# Patient Record
Sex: Male | Born: 1950 | Race: White | Hispanic: No | Marital: Married | Smoking: Former smoker
Health system: Southern US, Community
[De-identification: ages and names within clinical notes are randomized; demographics above are authoritative.]

## PROBLEM LIST (undated history)

## (undated) DIAGNOSIS — K611 Rectal abscess: Secondary | ICD-10-CM

## (undated) DIAGNOSIS — G4730 Sleep apnea, unspecified: Secondary | ICD-10-CM

## (undated) DIAGNOSIS — G1221 Amyotrophic lateral sclerosis: Secondary | ICD-10-CM

## (undated) DIAGNOSIS — K219 Gastro-esophageal reflux disease without esophagitis: Secondary | ICD-10-CM

## (undated) DIAGNOSIS — J80 Acute respiratory distress syndrome: Secondary | ICD-10-CM

## (undated) DIAGNOSIS — Z77098 Contact with and (suspected) exposure to other hazardous, chiefly nonmedicinal, chemicals: Secondary | ICD-10-CM

## (undated) DIAGNOSIS — H47019 Ischemic optic neuropathy, unspecified eye: Secondary | ICD-10-CM

## (undated) DIAGNOSIS — M1990 Unspecified osteoarthritis, unspecified site: Secondary | ICD-10-CM

## (undated) DIAGNOSIS — H409 Unspecified glaucoma: Secondary | ICD-10-CM

## (undated) DIAGNOSIS — R269 Unspecified abnormalities of gait and mobility: Secondary | ICD-10-CM

## (undated) DIAGNOSIS — Z8709 Personal history of other diseases of the respiratory system: Secondary | ICD-10-CM

## (undated) DIAGNOSIS — F419 Anxiety disorder, unspecified: Secondary | ICD-10-CM

## (undated) DIAGNOSIS — E119 Type 2 diabetes mellitus without complications: Secondary | ICD-10-CM

## (undated) DIAGNOSIS — E7800 Pure hypercholesterolemia, unspecified: Secondary | ICD-10-CM

## (undated) DIAGNOSIS — F329 Major depressive disorder, single episode, unspecified: Secondary | ICD-10-CM

## (undated) DIAGNOSIS — A419 Sepsis, unspecified organism: Secondary | ICD-10-CM

## (undated) DIAGNOSIS — F32A Depression, unspecified: Secondary | ICD-10-CM

## (undated) DIAGNOSIS — A498 Other bacterial infections of unspecified site: Secondary | ICD-10-CM

## (undated) DIAGNOSIS — C801 Malignant (primary) neoplasm, unspecified: Secondary | ICD-10-CM

## (undated) DIAGNOSIS — F4310 Post-traumatic stress disorder, unspecified: Secondary | ICD-10-CM

## (undated) HISTORY — PX: SKIN CANCER EXCISION: SHX779

## (undated) HISTORY — DX: Rectal abscess: K61.1

## (undated) HISTORY — PX: CATARACT EXTRACTION: SUR2

## (undated) HISTORY — DX: Type 2 diabetes mellitus without complications: E11.9

## (undated) HISTORY — DX: Anxiety disorder, unspecified: F41.9

## (undated) HISTORY — DX: Post-traumatic stress disorder, unspecified: F43.10

## (undated) HISTORY — PX: INCISION AND DRAINAGE PERIRECTAL ABSCESS: SHX1804

## (undated) HISTORY — DX: Contact with and (suspected) exposure to other hazardous, chiefly nonmedicinal, chemicals: Z77.098

## (undated) HISTORY — DX: Depression, unspecified: F32.A

## (undated) HISTORY — DX: Major depressive disorder, single episode, unspecified: F32.9

---
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
## 1999-03-11 ENCOUNTER — Encounter: Payer: Self-pay | Admitting: Emergency Medicine

## 1999-03-11 ENCOUNTER — Emergency Department (HOSPITAL_COMMUNITY): Admission: EM | Admit: 1999-03-11 | Discharge: 1999-03-11 | Payer: Self-pay | Admitting: Emergency Medicine

## 1999-03-20 ENCOUNTER — Encounter: Payer: Self-pay | Admitting: Orthopedic Surgery

## 1999-03-20 ENCOUNTER — Ambulatory Visit (HOSPITAL_COMMUNITY): Admission: RE | Admit: 1999-03-20 | Discharge: 1999-03-20 | Payer: Self-pay | Admitting: Orthopedic Surgery

## 1999-07-12 ENCOUNTER — Emergency Department (HOSPITAL_COMMUNITY): Admission: EM | Admit: 1999-07-12 | Discharge: 1999-07-12 | Payer: Self-pay | Admitting: Emergency Medicine

## 2000-08-18 ENCOUNTER — Inpatient Hospital Stay (HOSPITAL_COMMUNITY): Admission: EM | Admit: 2000-08-18 | Discharge: 2000-08-21 | Payer: Self-pay | Admitting: *Deleted

## 2001-02-11 DIAGNOSIS — A419 Sepsis, unspecified organism: Secondary | ICD-10-CM

## 2001-02-11 DIAGNOSIS — A498 Other bacterial infections of unspecified site: Secondary | ICD-10-CM

## 2001-02-11 DIAGNOSIS — J80 Acute respiratory distress syndrome: Secondary | ICD-10-CM

## 2001-02-11 HISTORY — PX: APPENDECTOMY: SHX54

## 2001-02-11 HISTORY — DX: Acute respiratory distress syndrome: J80

## 2001-02-11 HISTORY — DX: Other bacterial infections of unspecified site: A49.8

## 2001-02-11 HISTORY — DX: Sepsis, unspecified organism: A41.9

## 2001-07-12 ENCOUNTER — Encounter: Payer: Self-pay | Admitting: Surgery

## 2001-07-12 ENCOUNTER — Encounter (INDEPENDENT_AMBULATORY_CARE_PROVIDER_SITE_OTHER): Payer: Self-pay | Admitting: Specialist

## 2001-07-13 ENCOUNTER — Inpatient Hospital Stay (HOSPITAL_COMMUNITY): Admission: EM | Admit: 2001-07-13 | Discharge: 2001-08-05 | Payer: Self-pay | Admitting: Emergency Medicine

## 2001-07-14 ENCOUNTER — Encounter: Payer: Self-pay | Admitting: Surgery

## 2001-07-14 ENCOUNTER — Encounter: Payer: Self-pay | Admitting: Critical Care Medicine

## 2001-07-15 ENCOUNTER — Encounter: Payer: Self-pay | Admitting: Critical Care Medicine

## 2001-07-20 ENCOUNTER — Encounter: Payer: Self-pay | Admitting: Pulmonary Disease

## 2001-07-21 ENCOUNTER — Encounter: Payer: Self-pay | Admitting: Pulmonary Disease

## 2001-07-25 ENCOUNTER — Encounter: Payer: Self-pay | Admitting: Pulmonary Disease

## 2001-07-26 ENCOUNTER — Encounter: Payer: Self-pay | Admitting: Pulmonary Disease

## 2001-07-27 ENCOUNTER — Encounter: Payer: Self-pay | Admitting: Pulmonary Disease

## 2001-07-28 ENCOUNTER — Encounter: Payer: Self-pay | Admitting: Pulmonary Disease

## 2001-08-04 ENCOUNTER — Encounter: Payer: Self-pay | Admitting: Surgery

## 2001-08-05 ENCOUNTER — Encounter: Payer: Self-pay | Admitting: Surgery

## 2001-09-15 ENCOUNTER — Encounter: Payer: Self-pay | Admitting: Emergency Medicine

## 2001-09-15 ENCOUNTER — Emergency Department (HOSPITAL_COMMUNITY): Admission: EM | Admit: 2001-09-15 | Discharge: 2001-09-15 | Payer: Self-pay | Admitting: Emergency Medicine

## 2001-09-21 ENCOUNTER — Ambulatory Visit (HOSPITAL_COMMUNITY): Admission: RE | Admit: 2001-09-21 | Discharge: 2001-09-21 | Payer: Self-pay | Admitting: Gastroenterology

## 2001-09-21 ENCOUNTER — Encounter (INDEPENDENT_AMBULATORY_CARE_PROVIDER_SITE_OTHER): Payer: Self-pay | Admitting: Specialist

## 2002-10-14 ENCOUNTER — Encounter: Payer: Self-pay | Admitting: Surgery

## 2002-10-14 ENCOUNTER — Ambulatory Visit (HOSPITAL_COMMUNITY): Admission: RE | Admit: 2002-10-14 | Discharge: 2002-10-14 | Payer: Self-pay | Admitting: Surgery

## 2004-11-24 ENCOUNTER — Emergency Department (HOSPITAL_COMMUNITY): Admission: EM | Admit: 2004-11-24 | Discharge: 2004-11-24 | Payer: Self-pay | Admitting: Emergency Medicine

## 2004-11-28 ENCOUNTER — Ambulatory Visit (HOSPITAL_COMMUNITY): Admission: AD | Admit: 2004-11-28 | Discharge: 2004-11-28 | Payer: Self-pay

## 2004-12-06 ENCOUNTER — Ambulatory Visit (HOSPITAL_COMMUNITY): Admission: AD | Admit: 2004-12-06 | Discharge: 2004-12-06 | Payer: Self-pay | Admitting: General Surgery

## 2004-12-08 ENCOUNTER — Emergency Department (HOSPITAL_COMMUNITY): Admission: EM | Admit: 2004-12-08 | Discharge: 2004-12-08 | Payer: Self-pay | Admitting: Emergency Medicine

## 2005-02-11 DIAGNOSIS — Z8709 Personal history of other diseases of the respiratory system: Secondary | ICD-10-CM

## 2005-02-11 HISTORY — PX: REPLACEMENT TOTAL KNEE: SUR1224

## 2005-02-11 HISTORY — PX: KNEE ARTHROSCOPY: SUR90

## 2005-02-11 HISTORY — DX: Personal history of other diseases of the respiratory system: Z87.09

## 2008-12-19 ENCOUNTER — Encounter: Admission: RE | Admit: 2008-12-19 | Discharge: 2008-12-19 | Payer: Self-pay | Admitting: Internal Medicine

## 2008-12-23 ENCOUNTER — Ambulatory Visit (HOSPITAL_BASED_OUTPATIENT_CLINIC_OR_DEPARTMENT_OTHER): Admission: RE | Admit: 2008-12-23 | Discharge: 2008-12-23 | Payer: Self-pay | Admitting: Orthopedic Surgery

## 2009-03-22 ENCOUNTER — Inpatient Hospital Stay (HOSPITAL_COMMUNITY): Admission: RE | Admit: 2009-03-22 | Discharge: 2009-03-24 | Payer: Self-pay | Admitting: Orthopedic Surgery

## 2009-04-17 ENCOUNTER — Encounter (HOSPITAL_COMMUNITY): Admission: RE | Admit: 2009-04-17 | Discharge: 2009-05-17 | Payer: Self-pay | Admitting: Orthopedic Surgery

## 2009-05-19 ENCOUNTER — Encounter (HOSPITAL_COMMUNITY): Admission: RE | Admit: 2009-05-19 | Discharge: 2009-06-18 | Payer: Self-pay | Admitting: Orthopedic Surgery

## 2010-01-13 ENCOUNTER — Emergency Department (HOSPITAL_COMMUNITY)
Admission: EM | Admit: 2010-01-13 | Discharge: 2010-01-13 | Payer: Self-pay | Source: Home / Self Care | Admitting: Emergency Medicine

## 2010-05-03 LAB — BASIC METABOLIC PANEL
BUN: 7 mg/dL (ref 6–23)
Chloride: 100 mEq/L (ref 96–112)
Chloride: 99 mEq/L (ref 96–112)
GFR calc Af Amer: >60 mL/min (ref >60)
Glucose, Bld: 271 mg/dL — ABNORMAL HIGH (ref 70–99)
Potassium: 3.9 mEq/L (ref 3.5–5.1)
Potassium: 4 mEq/L (ref 3.5–5.1)

## 2010-05-03 LAB — URINALYSIS, ROUTINE W REFLEX MICROSCOPIC
Bilirubin Urine: NEGATIVE
Glucose, UA: >1000 mg/dL — AB
Hgb urine dipstick: NEGATIVE
Ketones, ur: NEGATIVE mg/dL
Leukocytes, UA: NEGATIVE
Nitrite: NEGATIVE
Protein, ur: NEGATIVE mg/dL
Specific Gravity, Urine: 1.01 (ref 1.005–1.030)
Urobilinogen, UA: 1 mg/dL (ref 0.0–1.0)
pH: 5.5 (ref 5.0–8.0)

## 2010-05-03 LAB — URINE MICROSCOPIC-ADD ON

## 2010-05-03 LAB — CBC
HCT: 34.3 % — ABNORMAL LOW (ref 39.0–52.0)
HCT: 36 % — ABNORMAL LOW (ref 39.0–52.0)
HCT: 43.8 % (ref 39.0–52.0)
Hemoglobin: 12.6 g/dL — ABNORMAL LOW (ref 13.0–17.0)
Hemoglobin: 15.4 g/dL (ref 13.0–17.0)
MCHC: 35.3 g/dL (ref 30.0–36.0)
MCV: 85.8 fL (ref 78.0–100.0)
MCV: 86.7 fL (ref 78.0–100.0)
MCV: 89.9 fL (ref 78.0–100.0)
Platelets: 161 10*3/uL (ref 150–400)
Platelets: 193 10*3/uL (ref 150–400)
RBC: 4.15 MIL/uL — ABNORMAL LOW (ref 4.22–5.81)
RBC: 5.1 MIL/uL (ref 4.22–5.81)
RDW: 13.1 % (ref 11.5–15.5)
WBC: 5.5 10*3/uL (ref 4.0–10.5)
WBC: 9.4 10*3/uL (ref 4.0–10.5)
WBC: 9.6 10*3/uL (ref 4.0–10.5)

## 2010-05-03 LAB — COMPREHENSIVE METABOLIC PANEL
ALT: 33 U/L (ref 0–53)
AST: 19 U/L (ref 0–37)
Albumin: 4.2 g/dL (ref 3.5–5.2)
Alkaline Phosphatase: 64 U/L (ref 39–117)
BUN: 10 mg/dL (ref 6–23)
CO2: 27 mEq/L (ref 19–32)
Calcium: 9.1 mg/dL (ref 8.4–10.5)
Chloride: 99 mEq/L (ref 96–112)
Creatinine, Ser: 0.79 mg/dL (ref 0.4–1.5)
GFR calc Af Amer: >60 mL/min (ref >60)
GFR calc non Af Amer: >60 mL/min (ref >60)
Glucose, Bld: 416 mg/dL — ABNORMAL HIGH (ref 70–99)
Potassium: 4.5 mEq/L (ref 3.5–5.1)
Sodium: 133 mEq/L — ABNORMAL LOW (ref 135–145)
Total Bilirubin: 0.6 mg/dL (ref 0.3–1.2)
Total Protein: 6.7 g/dL (ref 6.0–8.3)

## 2010-05-03 LAB — ABO/RH: ABO/RH(D): A POS

## 2010-05-03 LAB — GLUCOSE, CAPILLARY
Glucose-Capillary: 239 mg/dL — ABNORMAL HIGH (ref 70–99)
Glucose-Capillary: 245 mg/dL — ABNORMAL HIGH (ref 70–99)
Glucose-Capillary: 284 mg/dL — ABNORMAL HIGH (ref 70–99)
Glucose-Capillary: 311 mg/dL — ABNORMAL HIGH (ref 70–99)

## 2010-05-03 LAB — DIFFERENTIAL
Basophils Absolute: 0 10*3/uL (ref 0.0–0.1)
Basophils Relative: 0 % (ref 0–1)
Eosinophils Absolute: 0.1 10*3/uL (ref 0.0–0.7)
Eosinophils Relative: 1 % (ref 0–5)
Lymphocytes Relative: 28 % (ref 12–46)
Lymphs Abs: 1.5 10*3/uL (ref 0.7–4.0)
Monocytes Absolute: 0.5 10*3/uL (ref 0.1–1.0)
Monocytes Relative: 9 % (ref 3–12)
Neutro Abs: 3.4 10*3/uL (ref 1.7–7.7)
Neutrophils Relative %: 62 % (ref 43–77)

## 2010-05-03 LAB — PROTIME-INR
INR: 0.86 (ref 0.00–1.49)
Prothrombin Time: 11.6 seconds (ref 11.6–15.2)

## 2010-05-03 LAB — APTT: aPTT: 29 seconds (ref 24–37)

## 2010-05-03 LAB — POCT I-STAT GLUCOSE
Glucose, Bld: 227 mg/dL — ABNORMAL HIGH (ref 70–99)
Operator id: 238831

## 2010-05-13 DIAGNOSIS — H47019 Ischemic optic neuropathy, unspecified eye: Secondary | ICD-10-CM

## 2010-05-13 HISTORY — DX: Ischemic optic neuropathy, unspecified eye: H47.019

## 2010-05-16 LAB — BASIC METABOLIC PANEL
Calcium: 9.2 mg/dL (ref 8.4–10.5)
Chloride: 101 mEq/L (ref 96–112)
Creatinine, Ser: 0.71 mg/dL (ref 0.4–1.5)
GFR calc Af Amer: >60 mL/min (ref >60)
GFR calc non Af Amer: >60 mL/min (ref >60)

## 2010-05-16 LAB — GLUCOSE, CAPILLARY: Glucose-Capillary: 195 mg/dL — ABNORMAL HIGH (ref 70–99)

## 2010-05-16 LAB — POCT HEMOGLOBIN-HEMACUE: Hemoglobin: 16.9 g/dL (ref 13.0–17.0)

## 2010-06-29 NOTE — H&P (Signed)
[facility]  Patient:    Javier Cook, Javier Cook                     MRN: [id number] Adm. Date:  [id number] Disc. Date: [id number] Attending:  Rachael Fee Dictator:   Young Berry Scott, N.P.                         History and Physical  REVIEW OF SYSTEMS:  Patient is a well-nourished, well-developed white male. Labs and vital signs already dictated.  He is relaxed and cooperative for the exam.  He is alert, talkative and appropriate.  HEAD:  Normocephalic, without lesions.  Patient has a history of basal cell CA with prior excisions on his nose.  Skin is generally smooth and tan with heavily maculed.  Patient does have one pink, pearly lesion in the center of his back and he is advised to follow up with his dermatologist to have this examined, but the skin on the lesion is intact.  EENT:  Sclerae and conjunctiva are clear.  PERRLA.  No rhinorrhea.  Hearing intact.  Teeth show evidence of extensive dental work.  No breath odor. Pharynx noninjected.  Neck is supple without thyromegaly.  CARDIOVASCULAR:  S1 and S2 is heard, no clicks, murmurs or gallops.  Regular rate and rhythm.  No carotid murmurs heard.  Peripheral pulses are 2+ no edema.  The extremities are pink and warm.  Lungs are clear.  Abdomen is flat, soft, quiet, nontender, no masses.  Genitalia is deferred.  MUSCULOSKELETAL:  Patient complains of pain with extension movement of his right elbow that has persisted on and off for over a year, worse when at terminal extension and when lifting objects from a flat surface.  Patient shows minimal weakness in grasp with his right hand due to some pain.  He is currently treating this with NSAIDS.  Strength is otherwise 5/5 in extremities, and posture is upright, gait is normal.  NEURO:  Cranial nerves 2-12 are intact.  Deep tendon reflexes are 2+/5.  Motor movements are smooth without tremor.  Sensory is intact.  Romberg is without findings.                 DD:  08/22/00 TD:  08/23/00 Job: [id number]

## 2010-06-29 NOTE — Op Note (Signed)
NAME:  Javier Cook, Javier Cook NO.:  [id number]   MEDICAL RECORD NO.:  [id number]          PATIENT TYPE:  AMB   LOCATION:  DAY                          FACILITY:  [facility]   PHYSICIAN:  Adolph Pollack, M.D.DATE OF BIRTH:  [birth year]   DATE OF PROCEDURE:  12/06/2004  DATE OF DISCHARGE:                                 OPERATIVE REPORT   PREOPERATIVE DIAGNOSIS:  Anorectal abscess.   POSTOPERATIVE DIAGNOSIS:  Anorectal abscess.   PROCEDURE:  Complex I&D of anorectal abscess.   SURGEON:  Adolph Pollack, M.D.   ANESTHESIA:  General.   INDICATIONS:  Mr. Frerking is a 60 year old male who approximately 8 days ago  underwent an incision and drainage of a left anterior perianal abscess by  Dr. Lebron Conners. He began having some problems with his right side and  swelling on that side and presented to the urgent office where he was found  to have a right perianal abscess. He now presents for complex incision and  drainage of that. Of note is that he had a very significant anorectal  abscess back in 2003 that was incised and drained. He was treated with  antibiotics and had a severe C. Difficile colitis. Every since that incision  and drainage, he says he has had a little problem with incontinence at  times.   TECHNIQUE:  He was seen in the holding area and then brought to the  operating room, placed supine on the operating table and a general  anesthetic was administered. He was then placed in the lithotomy position  and the perianal area was sterilely prepped and draped. The left anterior  wound was noted and was clean. There was a fluctuant area in the right  lateral region. A full-thickness triangular plug of skin and subcutaneous  tissue was removed sharply and purulent material evacuated. The abscess did  seem to track posteriorly. A culture was taken. I then debrided some of the  indurated subcutaneous tissue sharply. The bleeding points were controlled  with the  cautery. I did a digital rectal exam but did not feel an obvious  fistulous connection or papilla. I then packed the wounds with the moist  gauze followed by a dry dressing.   He tolerated the procedure well without any apparent complications and was  taken to the recovery room in satisfactory condition. He will be sent home  tonight with wound care instructions. I will refrain from putting him on  antibiotics at this time. He will be followed up in the office.      Adolph Pollack, M.D.  Electronically Signed     TJR/MEDQ  D:  12/06/2004  T:  12/06/2004  Job:  [id number]   cc:   Thora Lance, M.D.  Fax: [phone]

## 2010-06-29 NOTE — Discharge Summary (Signed)
[facility]  Patient:    Javier Cook, Javier Cook Visit Number: [id number] MRN: [id number]          Service Type: SUR Location: 3W 0373 01 Attending Physician:  Katha Cabal Dictated by:   [place] Daphine Deutscher, M.D. Admit Date:  07/12/2001 Discharge Date: 08/05/2001   CC:         Thora Lance, M.D.   Discharge Summary  CHIEF COMPLAINT:  Nausea, vomiting, and diarrhea.  HISTORY OF PRESENT ILLNESS:  Javier Cook is a 60 year old male who was admitted on Sunday, July 12, 2001, at 1330 hours with the above-mentioned complaints.  He has had drainage of a large, high, supralevator perirectal abscess on Wednesday, May 28, at [facility].  At that time he had about a four to five-day history of coccygeal pain but only at that point had developed any redness, and this abscess was found during an exam under anesthesia.  He was placed on Augmentin at that time and was seen back in the office by Dr. Jamey Ripa on Friday, May 30, and the packing of his right-sided abscess was removed.  His wife called on Sunday stating that he had been having nausea and vomiting and diarrhea and complaining of some increasing pain in his lower abdomen.  He had also had a temperature earlier in the day.  PAST MEDICAL HISTORY:  He does smoke.  He has had no prior abdominal surgery. There may well be a history of depression in his past.  ALLERGIES:  No known allergies.  SOCIAL HISTORY:  He is married.  PHYSICAL EXAMINATION:  VITAL SIGNS:  At the time he was seen in the ED, he was noted to be afebrile, blood pressure 129/80, pulse rate of 109.  HEENT:  There was no scleral icterus.  NECK:  Supple.  CHEST:  Clear to auscultation.  CARDIAC:  Sinus rhythm without murmurs.  ABDOMEN:  No rebound or guarding and was essentially nontender.  PELVIC:  Right perirectal drainage wound with decreasing induration and decreasing erythema from his prior presentation  when the abscess was drained. No crepitus.  PLAN:  Admit for hydration, observation, and to get CT scan of the pelvis to look for recurrent or undrained supralevator abscess, and check his white count.  HOSPITAL COURSE:  The patient was admitted and had the aforementioned studies ordered.  He had been placed on Unasyn.  CT scan report was reviewed later in the day with the radiologist, and his concerns included small pockets of free air in the lower abdomen as well as some air in the subcutaneous tissue around the penis.  The patient had a white count in excessive of 20,000.  A C. difficile stool sample was reported as negative.  The patient was seen 2130 hours and discussed my concern about possibly undrained or advancing pelvic infection, and laparotomy, possible colostomy was discussed with him in person and with his wife by phone.  We proceeded to the operating room that night.  The patient was taken to the OR that night, where an EUA, exploration of the right supralevator abscess cavity was performed, followed by an exploratory laparotomy with a retrocecal appendectomy, and finally replacement of drain into the right perirectal space.  No undrained pus was found in the pelvis, and there was no evidence of ongoing infection above or in the peritoneal cavity.  The patient was seen that morning following surgery and looked and felt much better.  The procedure was explained to him, and he  seemed to be getting along better.  During the early morning hours of July 14, 2001, he began to have increased work of breathing.  He was seen by me at 0940 hours, in which case his white count was still elevated at 23,000 and work of breathing was markedly increased.  Blood gas and stat CT scan of his chest were ordered to look for evidence of a pulmonary embolus.  At that point the patient went into SIRS syndrome with ARDS requiring intubation and consultation with the pulmonary service.  He was  placed on the Xigris protocol and the ventilator was managed by the pulmonary service.  He had a several-day hospitalization in the intensive care unit with antibiotic coverage continuing.  Two days later a repeat C. difficile proved to be positive, and it was felt this was the cause.  He was treated with oral Flagyl and vancomycin and subsequently an infectious disease consult was obtained, who recommended a feeding tube for duodenal placement of the antibiotics, and this was performed.  The patient began to improve.  A CT scan was performed to make sure that there was not a fulminant colitis that would require surgery, and there was not.  There was not evidence for surgical intervention.  The patient gradually got better and was able to be extubated.  He throughout the course got TNA.  He did have some mental status disorientation after he was extubated, but this cleared.  By June 25 he was feeling much better.  Vital signs were stable.  He was ready to go home and was given Vicodin for pain. He was asked to return in two weeks.  Condition was markedly improved. Following the recommendations of Dr. Orvan Falconer, his vancomycin had been discontinued and it was felt that his C. difficile colitis had resoled.  FINAL DIAGNOSES:  Sepsis with systemic inflammatory response syndrome related to Clostridium difficile colitis. Dictated by:   [place] Daphine Deutscher, M.D. Attending Physician:  Katha Cabal DD:  08/12/01 TD:  08/12/01 Job: [id number]

## 2010-06-29 NOTE — H&P (Signed)
[facility]  Patient:    Javier Cook, Javier Cook                     MRN: [id number] Adm. Date:  [id number] Attending:  Jasmine Pang Dictator:   [name], R.N., F.N.P.                   Psychiatric Admission Assessment  DATE OF ADMISSION:  09/02/00  PATIENT IDENTIFICATION:  This is a 60 year old Caucasian male who is married. He is a voluntary admission for depression with suicidal ideation.  HISTORY OF PRESENT ILLNESS:  This patient returned from vacation where he went on a fishing trip with his wife and a friend of his wife, a male friend.  The patient reports that the friend had been calling the house frequently and the patient finally confronted his wife with this on July 7 and she admitted that she had been sleeping with this man.  The patient reports that then he felt acutely depressed and stated "I didnt care if I lived or if I died."  The patient then on 07-23-24after not sleeping on July 7, requested that she assist him in going to the emergency room.  He felt that he needed psychiatric care because he was feeling very depressed and anxious.  The patient reports that he had a similar episode approximately 12 years ago when he got acutely depressed because the same thing happened with his previous wife at that time. She, at that time, also admitted that she was having an extramarital affair with him and their relationship ended in divorce.  The patient currently endorses difficulty sleeping over the past few weeks to months with initial insomnia and generally feeling not rested after sleeping.  He reports that his appetite is satisfactory.  He denies any auditory or visual hallucinations although he states that he is having occasional flashbacks from his years in [place] and has a previous history of being treated for PTSD related to his experiences in the [place] war.  The patient does report that he has felt homicidal toward the  friend without any specific intent or specific plan.  PAST PSYCHIATRIC HISTORY:  The patient has a history of PTSD from the [place] war.  He [facility] approximately 20 years ago related to his PTSD and was hospitalized one other time at in [place] approximately 11 years ago.  He has no other recent inpatient hospitalizations.  SUBSTANCE ABUSE HISTORY:  The patient reports he uses an occasional beer, one only on social occasions.  Denies any use of illegal drugs.  Denies the abuse of prescription drugs.  PAST MEDICAL HISTORY:  The patient is followed by Dr. Eliezer Champagne here in [place] on [address]  Medical problems are hyperlipidemia, arthritis, gastric reflux, and sciatica.  MEDICATIONS: 1. Flurazepam 30 mg p.o. q.h.s. 2. Alprazolam 1 mg p.o. q.8h. p.r.n.  The patient is only using this one    tablet daily on days he works; on days he does not work, he uses no    alprazolam. 3. Lipitor 10 mg p.o. q.h.s. 4. Prilosec 20 mg q.a.m. 5. Vioxx 25 mg q.d. 6. Zoloft 100 mg p.o. q.d.  DRUG ALLERGIES:  No known drug allergies.  Ambien causes persistent erections.  PHYSICAL EXAMINATION:  The patients PE is pending.  VITAL SIGNS:  Within normal limits upon admission to the floor.  LABORATORY DATA:  Within normal limits  except for his glucose which was a bit low at 68.  SOCIAL HISTORY:  The patient has been married twice.  This is his second marriage, which has lasted for 10 years.  He has two stepchildren which he has helped raise.  He currently works as a Engineer, building services for [employer] and has steady employment and good work record.  The patient currently lives with his wife who also works full-time.  Family history is positive for a mother, a father, and sisters with history of depression.  MENTAL STATUS EXAMINATION:  This is a casually and neatly dressed Caucasian male who generally appears healthy, ambulates with  normal gait.  He has no somatic complaints today other than feeling anxious.  He has a sad affect with good eye contact.  He is cooperative and relaxed.  Speech is normal in pace and tone and is relevant.  Mood is depressed, sad, and discouraged.  Thought processes: Logical and goal-directed.  It is positive for suicidal ideation and homicidal ideation toward his wifes friend with no intent or specific plan.  No evidence of auditory or visual hallucinations or psychotic symptoms, no delusions or paranoia.  Cognitively, he is oriented x 3 and is intact.  ADMISSION DIAGNOSES: Axis I:    1. Rule out major depression, recurrent and severe, versus               adjustment disorder.            2. Post-traumatic stress disorder by history. Axis II:   Deferred. Axis III:  1. Hyperlipidemia.            2. Gastroesophageal reflux disease.            3. Chronic back pain. Axis IV:   Moderate problems with the primary support group, specifically his            relationship with his wife. Axis V:    Current 28, past year 66.  INITIAL PLAN OF CARE:  Plan is to admit the patient to stabilize his mood. Q.71m. checks in place and the patient promises safety.  We will provide him with an extra pad for his mattress secondary to his back pain which he states sometimes interferes with his sleep at night.  We will ask the case manager to do a family session with his wife to discuss their conflicts.  We will restart his routine medications and will add Seroquel 12.5 mg p.o. now and q.4h. p.r.n. for agitation and will also provide him 12.5 mg at h.s.  ESTIMATED LENGTH OF STAY:  Three to four days. DD:  08/19/00 TD:  08/19/00 Job: [id number]

## 2010-07-12 ENCOUNTER — Other Ambulatory Visit: Payer: Self-pay | Admitting: Gastroenterology

## 2010-07-12 ENCOUNTER — Ambulatory Visit (HOSPITAL_COMMUNITY)
Admission: RE | Admit: 2010-07-12 | Discharge: 2010-07-12 | Disposition: A | Discharge status: Home / Self Care | Payer: BC Managed Care – PPO | Source: Ambulatory Visit | Attending: Gastroenterology | Admitting: Gastroenterology

## 2010-07-12 DIAGNOSIS — Z860100 Personal history of colon polyps, unspecified: Secondary | ICD-10-CM | POA: Insufficient documentation

## 2010-07-12 DIAGNOSIS — R197 Diarrhea, unspecified: Secondary | ICD-10-CM | POA: Insufficient documentation

## 2010-07-12 DIAGNOSIS — D126 Benign neoplasm of colon, unspecified: Secondary | ICD-10-CM | POA: Insufficient documentation

## 2010-07-12 DIAGNOSIS — E119 Type 2 diabetes mellitus without complications: Secondary | ICD-10-CM | POA: Insufficient documentation

## 2010-07-12 DIAGNOSIS — K219 Gastro-esophageal reflux disease without esophagitis: Secondary | ICD-10-CM | POA: Insufficient documentation

## 2010-07-12 DIAGNOSIS — Z79899 Other long term (current) drug therapy: Secondary | ICD-10-CM | POA: Insufficient documentation

## 2010-07-12 NOTE — Op Note (Signed)
  NAME:  Javier Cook, KINES NO.:  [id number]  MEDICAL RECORD NO.:  [id number]           PATIENT TYPE:  O  LOCATION:  WLEN                         FACILITY:  [facility]  PHYSICIAN:  Danise Edge, M.D.   DATE OF BIRTH:  July 23, 1950  DATE OF PROCEDURE:  07/12/2010 DATE OF DISCHARGE:                              OPERATIVE REPORT   PROCEDURE:  Colonoscopy.  REFERRING PHYSICIAN:  Thora Lance, M.D.  HISTORY:  Mr. Burleigh Brockmann.  Wickstrom is a 60 year old male, born [birth year].  The patient has undergone surveillance colonoscopies to remove neoplastic colon polyps to prevent colon cancer in the past.  In 2008, the patient's surveillance colonoscopy was normal, except for the removal of a small descending colon adenomatous polyp.  The patient was diagnosed with irritable bowel syndrome years ago.  The patient has had recurrent perianal abscesses drained in the past. There is no history of inflammatory bowel disease.  The patient has intermittent nonbloody mushy bowel movements which occur unpredictably. He takes metformin on a daily basis which can cause diarrhea.  He also takes Zoloft chronically which has been associated with microscopic colitis.  The patient has chronic type 2 diabetes mellitus which can cause diabetic secretory diarrhea and be associated with small-bowel bacterial overgrowth which can cause diarrhea.  ENDOSCOPIST:  Danise Edge, M.D.  PREMEDICATION:  Fentanyl 75 mcg, Versed 7.5 mg.  PROCEDURE:  Anal inspection and digital rectal exam were normal.  The Pentax pediatric colonoscope was introduced into the rectum and advanced to cecum.  A normal-appearing appendiceal orifice and ileocecal valve were identified.  Colonic preparation for the exam today was good.  Rectum normal.  Retroflex view of the distal rectum normal.  Sigmoid colon and descending colon normal.  Splenic flexure normal.  Transverse colon normal.  Hepatic flexure  normal.  Ascending colon.  From the proximal ascending colon, a 4-mm sessile polyp was removed with cold snare.  The polyp could not be retrieved for pathological evaluation.  Cecum and ileocecal valve normal.  BIOPSIES:  Random colon biopsies were performed from the right colon and left colon to look for microscopic colitis.  ASSESSMENT: 1. History of neoplastic colon polyps, removed colonoscopically to     prevent colon cancer. 2. 4-mm sessile polyp was removed from the proximal ascending colon;     the polyp could not be retrieved for pathologic evaluation. 3. Random colon biopsies performed to look for microscopic colitis.  RECOMMENDATIONS:  Schedule repeat surveillance colonoscopy in 5 years.          ______________________________ Danise Edge, M.D.     MJ/MEDQ  D:  07/12/2010  T:  07/12/2010  Job:  [id number]  cc:   Thora Lance, M.D. Fax: [phone]  Electronically Signed by Danise Edge M.D. on 07/12/2010 05:29:06 PM

## 2010-09-06 ENCOUNTER — Encounter (INDEPENDENT_AMBULATORY_CARE_PROVIDER_SITE_OTHER): Payer: BC Managed Care – PPO | Admitting: Ophthalmology

## 2010-09-06 DIAGNOSIS — H35379 Puckering of macula, unspecified eye: Secondary | ICD-10-CM

## 2010-09-06 DIAGNOSIS — H35359 Cystoid macular degeneration, unspecified eye: Secondary | ICD-10-CM

## 2010-09-06 DIAGNOSIS — H43819 Vitreous degeneration, unspecified eye: Secondary | ICD-10-CM

## 2010-12-04 ENCOUNTER — Other Ambulatory Visit: Payer: Self-pay | Admitting: Neurology

## 2010-12-04 DIAGNOSIS — I6529 Occlusion and stenosis of unspecified carotid artery: Secondary | ICD-10-CM

## 2010-12-07 ENCOUNTER — Encounter (INDEPENDENT_AMBULATORY_CARE_PROVIDER_SITE_OTHER): Payer: BC Managed Care – PPO | Admitting: Ophthalmology

## 2012-11-30 DIAGNOSIS — Z23 Encounter for immunization: Secondary | ICD-10-CM | POA: Diagnosis not present

## 2012-11-30 DIAGNOSIS — M25519 Pain in unspecified shoulder: Secondary | ICD-10-CM | POA: Diagnosis not present

## 2012-12-03 DIAGNOSIS — M19019 Primary osteoarthritis, unspecified shoulder: Secondary | ICD-10-CM | POA: Diagnosis not present

## 2012-12-21 DIAGNOSIS — E785 Hyperlipidemia, unspecified: Secondary | ICD-10-CM | POA: Diagnosis not present

## 2012-12-21 DIAGNOSIS — F325 Major depressive disorder, single episode, in full remission: Secondary | ICD-10-CM | POA: Diagnosis not present

## 2012-12-21 DIAGNOSIS — E119 Type 2 diabetes mellitus without complications: Secondary | ICD-10-CM | POA: Diagnosis not present

## 2012-12-21 DIAGNOSIS — Z1331 Encounter for screening for depression: Secondary | ICD-10-CM | POA: Diagnosis not present

## 2012-12-21 DIAGNOSIS — K219 Gastro-esophageal reflux disease without esophagitis: Secondary | ICD-10-CM | POA: Diagnosis not present

## 2012-12-24 DIAGNOSIS — E119 Type 2 diabetes mellitus without complications: Secondary | ICD-10-CM | POA: Diagnosis not present

## 2013-04-21 ENCOUNTER — Other Ambulatory Visit: Payer: Self-pay | Admitting: Internal Medicine

## 2013-04-21 DIAGNOSIS — Z125 Encounter for screening for malignant neoplasm of prostate: Secondary | ICD-10-CM | POA: Diagnosis not present

## 2013-04-21 DIAGNOSIS — Z1331 Encounter for screening for depression: Secondary | ICD-10-CM | POA: Diagnosis not present

## 2013-04-21 DIAGNOSIS — Z0000 Encounter for general adult medical examination without abnormal findings: Secondary | ICD-10-CM | POA: Diagnosis not present

## 2013-04-21 DIAGNOSIS — Z87891 Personal history of nicotine dependence: Secondary | ICD-10-CM

## 2013-04-21 DIAGNOSIS — Z23 Encounter for immunization: Secondary | ICD-10-CM | POA: Diagnosis not present

## 2013-04-21 DIAGNOSIS — E119 Type 2 diabetes mellitus without complications: Secondary | ICD-10-CM | POA: Diagnosis not present

## 2013-05-25 ENCOUNTER — Ambulatory Visit
Admission: RE | Admit: 2013-05-25 | Discharge: 2013-05-25 | Disposition: A | Discharge status: Home / Self Care | Payer: PRIVATE HEALTH INSURANCE | Source: Ambulatory Visit | Attending: Internal Medicine | Admitting: Internal Medicine

## 2013-05-25 DIAGNOSIS — Z87891 Personal history of nicotine dependence: Secondary | ICD-10-CM

## 2013-11-29 DIAGNOSIS — K219 Gastro-esophageal reflux disease without esophagitis: Secondary | ICD-10-CM | POA: Diagnosis not present

## 2013-11-29 DIAGNOSIS — Z23 Encounter for immunization: Secondary | ICD-10-CM | POA: Diagnosis not present

## 2013-11-29 DIAGNOSIS — E119 Type 2 diabetes mellitus without complications: Secondary | ICD-10-CM | POA: Diagnosis not present

## 2014-02-26 DIAGNOSIS — J019 Acute sinusitis, unspecified: Secondary | ICD-10-CM | POA: Diagnosis not present

## 2014-06-03 DIAGNOSIS — R51 Headache: Secondary | ICD-10-CM | POA: Diagnosis not present

## 2014-06-03 DIAGNOSIS — E1142 Type 2 diabetes mellitus with diabetic polyneuropathy: Secondary | ICD-10-CM | POA: Diagnosis not present

## 2014-06-03 DIAGNOSIS — Z23 Encounter for immunization: Secondary | ICD-10-CM | POA: Diagnosis not present

## 2014-06-03 DIAGNOSIS — K219 Gastro-esophageal reflux disease without esophagitis: Secondary | ICD-10-CM | POA: Diagnosis not present

## 2014-06-03 DIAGNOSIS — G4733 Obstructive sleep apnea (adult) (pediatric): Secondary | ICD-10-CM | POA: Diagnosis not present

## 2014-10-06 DIAGNOSIS — E119 Type 2 diabetes mellitus without complications: Secondary | ICD-10-CM | POA: Diagnosis not present

## 2014-10-06 DIAGNOSIS — H52203 Unspecified astigmatism, bilateral: Secondary | ICD-10-CM | POA: Diagnosis not present

## 2014-10-06 DIAGNOSIS — H47012 Ischemic optic neuropathy, left eye: Secondary | ICD-10-CM | POA: Diagnosis not present

## 2014-10-06 DIAGNOSIS — Z961 Presence of intraocular lens: Secondary | ICD-10-CM | POA: Diagnosis not present

## 2014-10-06 DIAGNOSIS — Z794 Long term (current) use of insulin: Secondary | ICD-10-CM | POA: Diagnosis not present

## 2014-10-20 IMAGING — US US AORTA SCREENING (MEDICARE)
1 series · 14 of 15 positions shown · non-contrast
Comparison: None.

CLINICAL DATA: Medicare screening exam for abdominal aortic
aneurysm. Ex-smoker.

EXAM:
ABDOMINAL AORTA SCREENING ULTRASOUND
TECHNIQUE: Ultrasound examination of the abdominal aorta was performed as a
screening evaluation for abdominal aortic aneurysm.

[Series 1: us aorta screening (medicare) · 0.29mm/px · 14 of 15 slices shown]
[im 1/15]
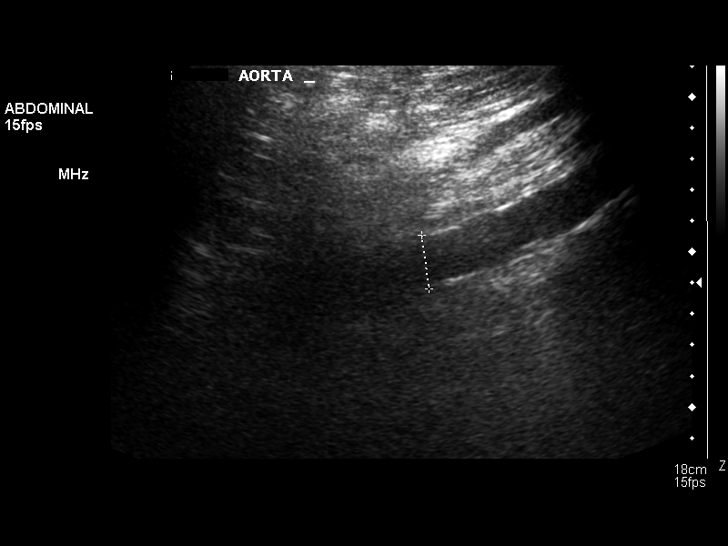
[im 2/15]
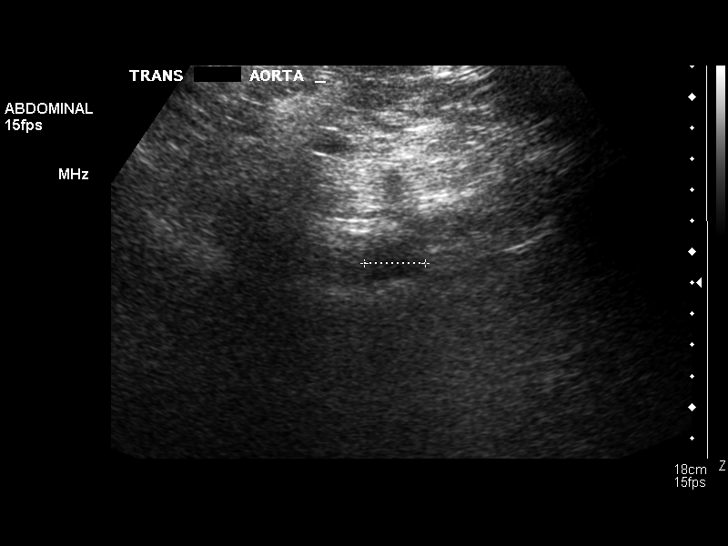
[im 3/15]
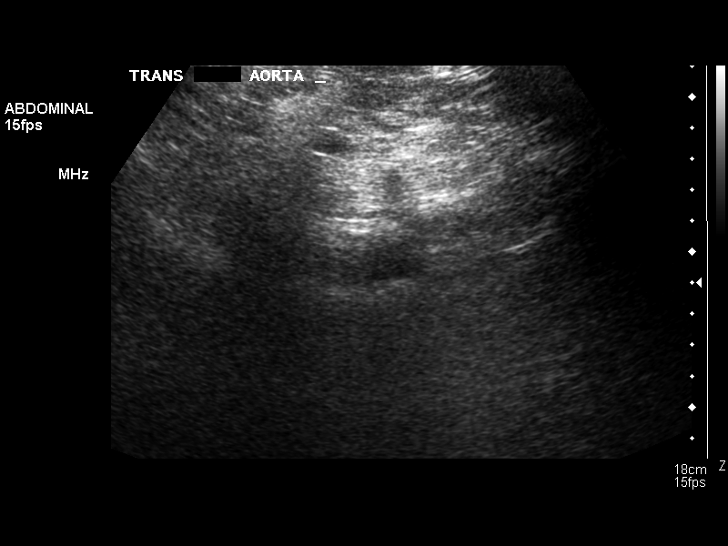
[im 4/15]
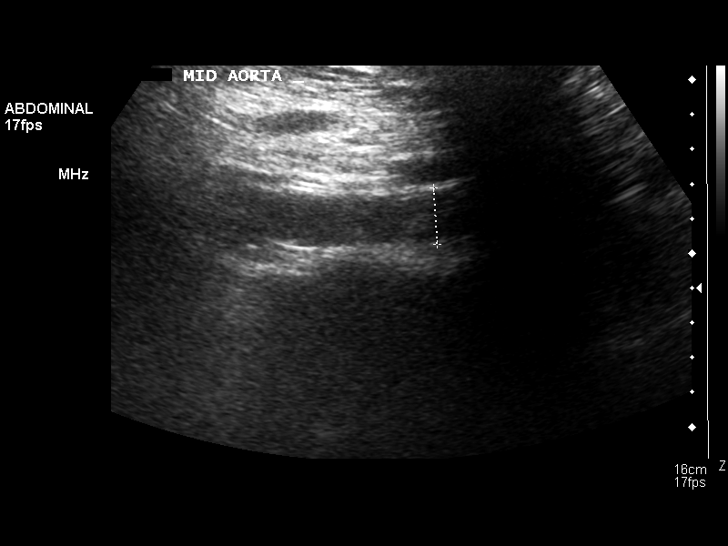
[im 5/15]
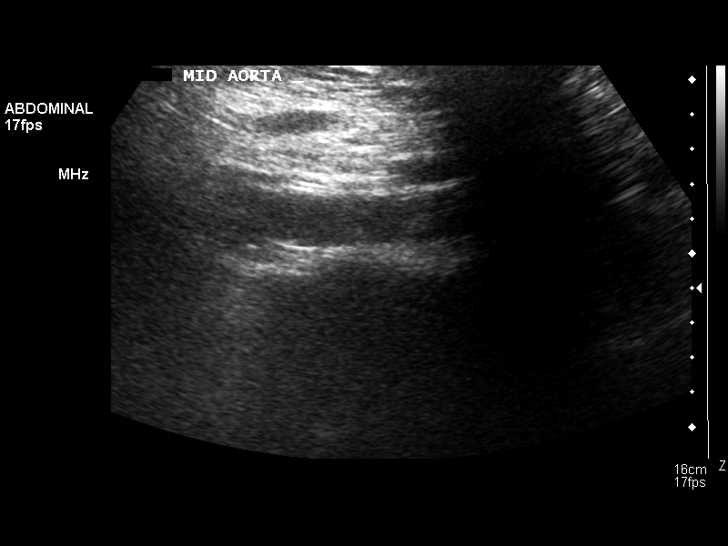
[im 6/15]
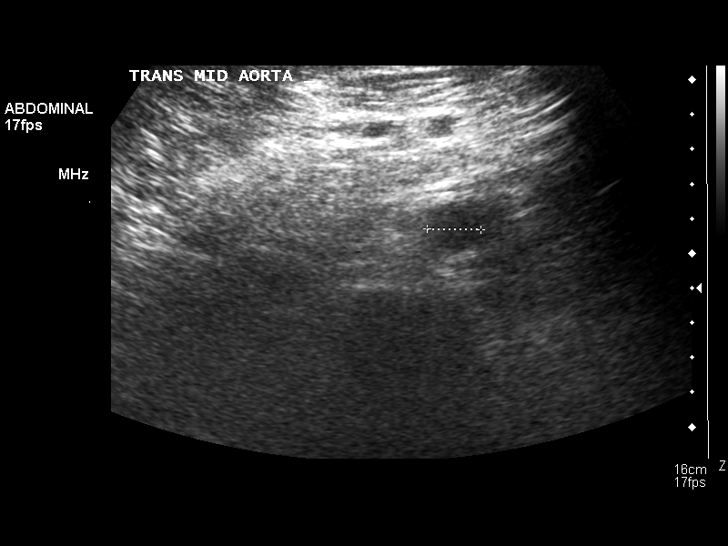
[im 7/15]
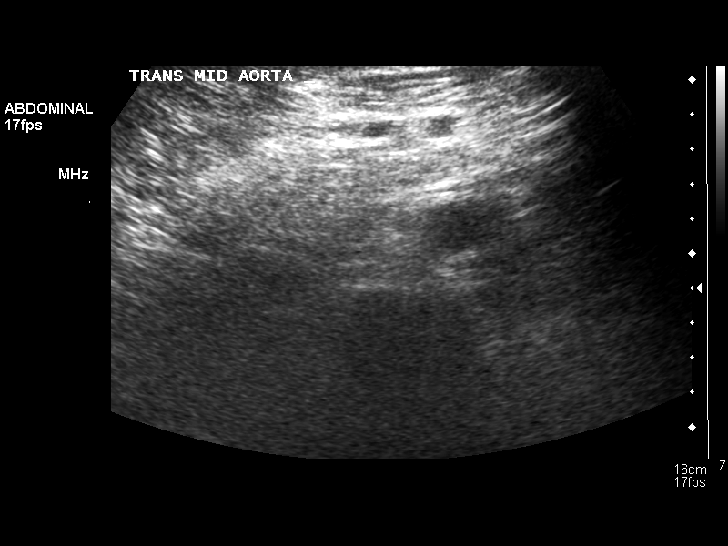
[im 9/15]
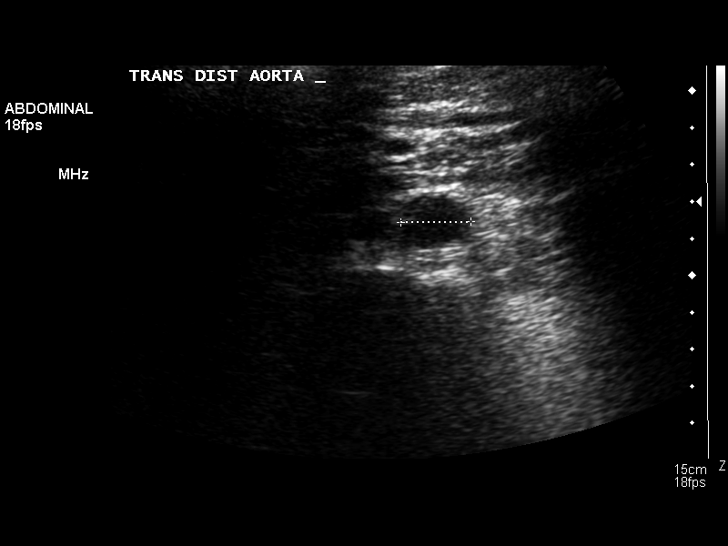
[im 10/15]
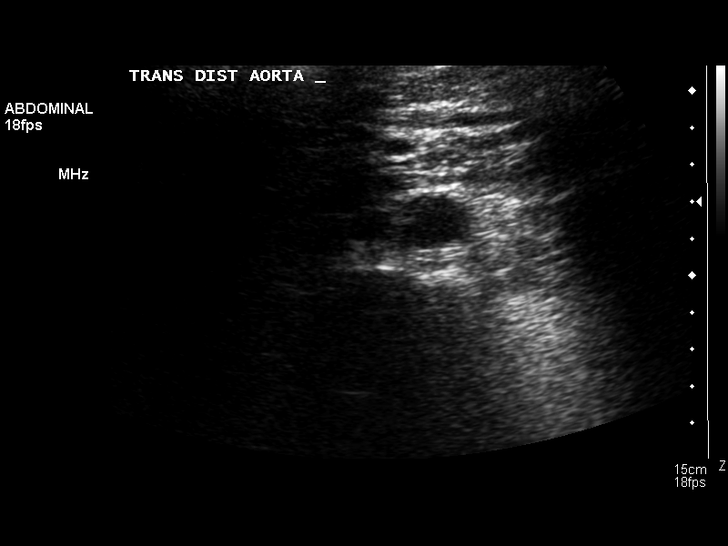
[im 11/15]
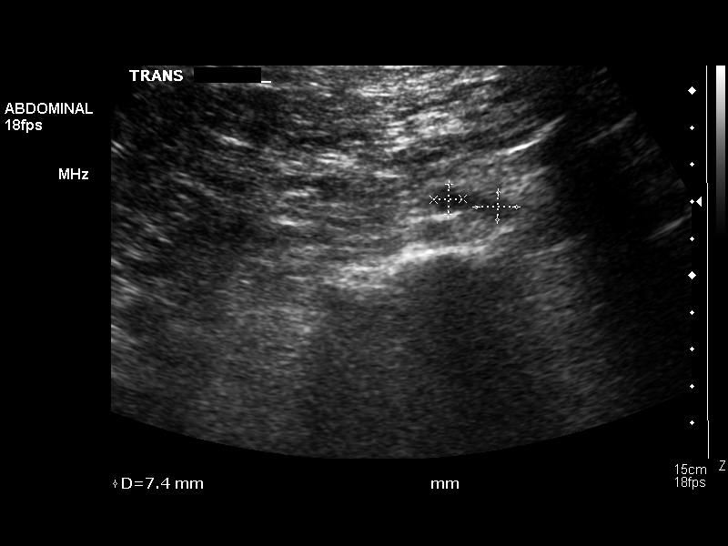
[im 12/15]
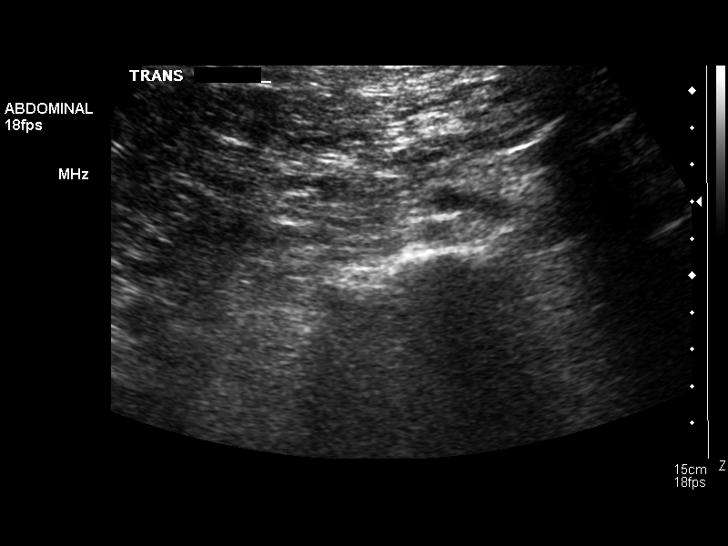
[im 13/15]
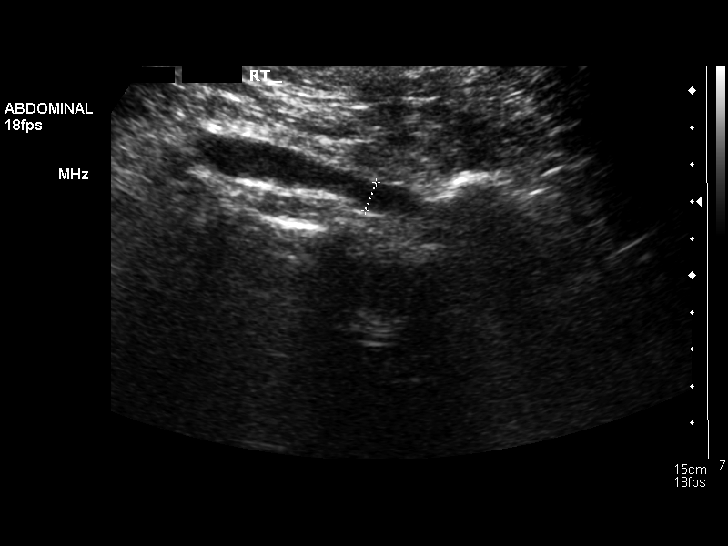
[im 14/15]
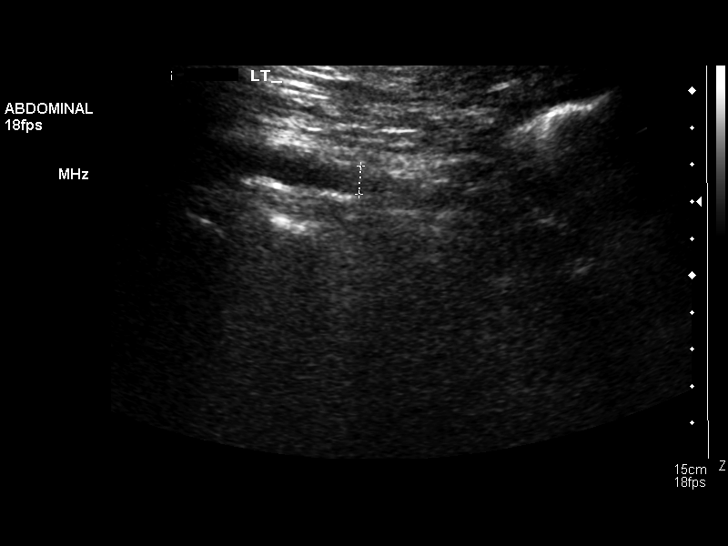
[im 15/15]
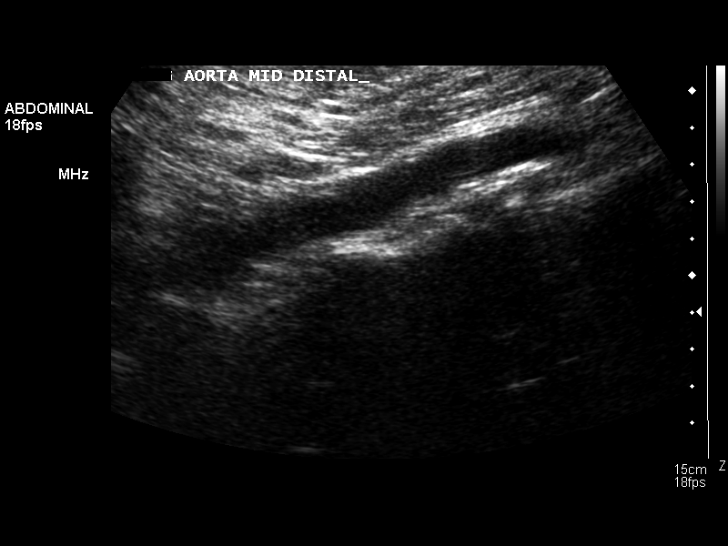

[14 of 15 positions shown; findings below may reference images not displayed]

FINDINGS: Abdominal Aorta

No aneurysm identified.

Maximum AP

Diameter:

Maximum TRV

Diameter:

Image portions of the common iliac arteries are normal in caliber.
IMPRESSION: No evidence of abdominal aortic aneurysm.

## 2014-12-05 DIAGNOSIS — G4733 Obstructive sleep apnea (adult) (pediatric): Secondary | ICD-10-CM | POA: Diagnosis not present

## 2014-12-05 DIAGNOSIS — Z794 Long term (current) use of insulin: Secondary | ICD-10-CM | POA: Diagnosis not present

## 2014-12-05 DIAGNOSIS — K219 Gastro-esophageal reflux disease without esophagitis: Secondary | ICD-10-CM | POA: Diagnosis not present

## 2014-12-05 DIAGNOSIS — Z1389 Encounter for screening for other disorder: Secondary | ICD-10-CM | POA: Diagnosis not present

## 2014-12-05 DIAGNOSIS — E119 Type 2 diabetes mellitus without complications: Secondary | ICD-10-CM | POA: Diagnosis not present

## 2014-12-05 DIAGNOSIS — Z23 Encounter for immunization: Secondary | ICD-10-CM | POA: Diagnosis not present

## 2015-06-06 DIAGNOSIS — E1142 Type 2 diabetes mellitus with diabetic polyneuropathy: Secondary | ICD-10-CM | POA: Diagnosis not present

## 2015-06-06 DIAGNOSIS — F3341 Major depressive disorder, recurrent, in partial remission: Secondary | ICD-10-CM | POA: Diagnosis not present

## 2015-06-06 DIAGNOSIS — Z0000 Encounter for general adult medical examination without abnormal findings: Secondary | ICD-10-CM | POA: Diagnosis not present

## 2015-06-06 DIAGNOSIS — K580 Irritable bowel syndrome with diarrhea: Secondary | ICD-10-CM | POA: Diagnosis not present

## 2015-06-06 DIAGNOSIS — E7800 Pure hypercholesterolemia, unspecified: Secondary | ICD-10-CM | POA: Diagnosis not present

## 2015-06-06 DIAGNOSIS — Z1389 Encounter for screening for other disorder: Secondary | ICD-10-CM | POA: Diagnosis not present

## 2015-06-06 DIAGNOSIS — Z79899 Other long term (current) drug therapy: Secondary | ICD-10-CM | POA: Diagnosis not present

## 2015-06-06 DIAGNOSIS — K219 Gastro-esophageal reflux disease without esophagitis: Secondary | ICD-10-CM | POA: Diagnosis not present

## 2015-06-06 DIAGNOSIS — Z794 Long term (current) use of insulin: Secondary | ICD-10-CM | POA: Diagnosis not present

## 2015-06-06 DIAGNOSIS — Z125 Encounter for screening for malignant neoplasm of prostate: Secondary | ICD-10-CM | POA: Diagnosis not present

## 2015-10-13 ENCOUNTER — Ambulatory Visit: Payer: PRIVATE HEALTH INSURANCE | Admitting: Gastroenterology

## 2015-10-24 ENCOUNTER — Telehealth: Payer: Self-pay

## 2015-10-24 NOTE — Telephone Encounter (Signed)
Pt received a triage letter from [facility]. Please call [phone]

## 2015-10-30 ENCOUNTER — Telehealth: Payer: Self-pay

## 2015-10-30 NOTE — Telephone Encounter (Signed)
Pt called and stated he has been waiting to hear about scheduling his tcs

## 2015-10-31 NOTE — Telephone Encounter (Signed)
I triaged pt and he needs ov due to meds.  He is scheduled for an OV with Neil Crouch, PA on 11/22/2015 at 2:30 pm.   I could not find his referral, I have left Vm for a return call to please have PCP resend his referral.

## 2015-10-31 NOTE — Telephone Encounter (Signed)
Triaged on 10/30/2015.

## 2015-11-02 NOTE — Telephone Encounter (Signed)
Referral is here from [place].  [place] Approval for medical care  [phone].

## 2015-11-20 NOTE — Telephone Encounter (Signed)
PT's wife called to see if we had the approval from [place]. I gave her the Approval number and asked her if she had questions to call the [place] and give them that number.

## 2015-11-21 ENCOUNTER — Telehealth: Payer: Self-pay

## 2015-11-21 NOTE — Telephone Encounter (Signed)
PT's wife called and is concerned about the [place] referral. She said she was told the colonoscopy had to be done by 12/01/2015. I do not see a time on the referral I have.  Pt does have Medicare.  PT is on the schedule for 11/23/2015 for OV with Neil Crouch, PA due to medications.   PT's wife will talk to [place] and if she does not get the answers she wants she might cancel the OV on 11/23/2015.  PT was first referred somewhere else and he did not want to drive the longer distance.  I called the [place] today and was on hold for 30 min and had to hang up to do patient care.

## 2015-11-22 ENCOUNTER — Ambulatory Visit: Payer: PRIVATE HEALTH INSURANCE | Admitting: Gastroenterology

## 2015-11-23 ENCOUNTER — Other Ambulatory Visit: Payer: Self-pay

## 2015-11-23 ENCOUNTER — Encounter: Payer: Self-pay | Admitting: Gastroenterology

## 2015-11-23 ENCOUNTER — Inpatient Hospital Stay (HOSPITAL_COMMUNITY): Admit: 2015-11-23 | Payer: Self-pay

## 2015-11-23 ENCOUNTER — Ambulatory Visit (INDEPENDENT_AMBULATORY_CARE_PROVIDER_SITE_OTHER): Payer: Medicare Other | Admitting: Gastroenterology

## 2015-11-23 VITALS — BP 146/86 | HR 81 | Temp 97.9°F | Ht 67.0 in | Wt 198.6 lb

## 2015-11-23 DIAGNOSIS — Z86010 Personal history of colonic polyps: Secondary | ICD-10-CM

## 2015-11-23 DIAGNOSIS — K529 Noninfective gastroenteritis and colitis, unspecified: Secondary | ICD-10-CM | POA: Diagnosis not present

## 2015-11-23 DIAGNOSIS — Z800 Family history of malignant neoplasm of digestive organs: Secondary | ICD-10-CM | POA: Diagnosis not present

## 2015-11-23 MED ORDER — PEG-KCL-NACL-NASULF-NA ASC-C 100 G PO SOLR: 1.0000 | ORAL | 0 refills | Status: DC

## 2015-11-23 NOTE — Patient Instructions (Signed)
1. Please have your blood work done as soon as possible. 2. Colonoscopy with Dr. Oneida Alar. Please see separate instructions.

## 2015-11-23 NOTE — Progress Notes (Signed)
CC'ED TO PCP 

## 2015-11-23 NOTE — Progress Notes (Addendum)
[name] KitchenREVIEWED. NO ADDITIONAL RECOMMENDATIONS.  Primary Care Physician:  Javier Shelling, MD  Primary Gastroenterologist:  Javier Drain, MD   Chief Complaint  Patient presents with  . Colonoscopy    due 5 yr. Hx polyps, mother died from colon cancer  . Irritable Bowel Syndrome    chronic diarrhea    HPI:  Javier Cook is a 65 y.o. male here To schedule surveillance colonoscopy. He has a personal history of adenomatous colon polyps, mother also had colon cancer in her 47s. Patient's last colonoscopy was in May 2012, by Dr. Johnathan Cook. He had a 4 mm sessile polyp removed, polyp cannot be retrieved for pathological evaluation. Random colon biopsies were performed for history of chronic diarrhea which were unremarkable. Patient also has a history of recurrent perianal abscesses requiring drainage but there is been no history of IBD. He has been on chronic metformin for years for type 2 diabetes.   Patient states he has 3-4 bowel movements every day. Every week or so he'll have 3 days of profuse diarrhea upwards to 10-15 loose stools during 24-hour period of time. No melena or rectal bleeding. States he was told he had IBS D years ago. He has tried Imodium 2 mg twice a day which causes constipation. He is on chronic metformin for years. He has diabetes dating back at least 15 years. Diabetes diagnosed when he went in for hemorrhoid surgery and ended up with ruptured abscess in the right lower quadrant requiring laparotomy, complicated by large and sepsis. Patient states he was intubated for 3 weeks.  Patient denies abdominal pain. He has some intermittent reflux, has been on Prilosec chronically. Not sure if he still needs it because he has made significant dietary changes over the years. He reports prior EGD with no evidence of Barrett's esophagus. I do not have that documentation. He states when he was in his 48s he had a bleeding ulcer. Denies celiac screening. No unintentional weight  loss.      Current Outpatient Prescriptions  Medication Sig Dispense Refill  . ALPRAZolam (XANAX) 1 MG tablet Take 0.5 mg by mouth 2 (two) times daily.    [name] Kitchen amitriptyline (ELAVIL) 10 MG tablet Take 10 mg by mouth 2 (two) times daily.    [name] Kitchen aspirin EC 81 MG tablet Take 81 mg by mouth daily.    . Cholecalciferol (VITAMIN D3) 1000 units CAPS Take 1 capsule by mouth daily.    . cyanocobalamin 1000 MCG tablet Take 1,000 mcg by mouth daily.    . DULoxetine (CYMBALTA) 20 MG capsule Take 60 mg by mouth daily.    . fluticasone (VERAMYST) 27.5 MCG/SPRAY nasal spray Place 1 spray into the nose 2 (two) times daily.    [name] Kitchen glipiZIDE (GLUCOTROL) 5 MG tablet Take 2.5 mg by mouth daily before breakfast.    . insulin glargine (LANTUS) 100 UNIT/ML injection Inject 44 Units into the skin daily.    . Melatonin 3 MG TABS Take 4 tablets by mouth at bedtime.    . metFORMIN (GLUCOPHAGE) 1000 MG tablet Take 1,000 mg by mouth 2 (two) times daily.    [name] Kitchen omeprazole (PRILOSEC) 20 MG capsule Take 20 mg by mouth daily.    . QUEtiapine (SEROQUEL) 25 MG tablet Take 25 mg by mouth at bedtime.    . simvastatin (ZOCOR) 40 MG tablet Take 40 mg by mouth daily.    . timolol (BETIMOL) 0.5 % ophthalmic solution Place 1 drop into the left eye 2 (two) times daily.    [name] Kitchen  UNABLE TO FIND CPAP 16 at night.    . zolpidem (AMBIEN) 10 MG tablet Take 10 mg by mouth at bedtime.    . peg 3350 powder (MOVIPREP) 100 g SOLR Take 1 kit (200 g total) by mouth as directed. 1 kit 0   No current facility-administered medications for this visit.     Allergies as of 11/23/2015  . (No Known Allergies)    Past Medical History:  Diagnosis Date  . Depression   . Diabetes ([place])   . Perirectal abscess    multiple I+D  . Post traumatic stress disorder     Past Surgical History:  Procedure Laterality Date  . APPENDECTOMY  2002   incidental appendectomy at time of surgery for "ruptured abscess in right lower abdomen" per patient. patient developed  ARDS/sepsis.  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 INCISION AND DRAINAGE PERIRECTAL ABSCESS     multiple  . KNEE ARTHROSCOPY Left 2007  . REPLACEMENT TOTAL KNEE Left 2007  . SKIN CANCER EXCISION      Family History  Problem Relation Age of Onset  . Colon cancer Mother 69    77, metastatic colon cancer  . Melanoma Sister     Social History   Social History  . Marital status: Married    Spouse name: N/A  . Number of children: N/A  . Years of education: N/A   Occupational History  . Not on file.   Social History Main Topics  . Smoking status: Former Research scientist (life sciences)  . Smokeless tobacco: Current User    Types: Chew  . Alcohol use No  . Drug use: No  . Sexual activity: Not on file   Other Topics Concern  . Not on file   Social History Narrative   [employer]      ROS:  General: Negative for anorexia, weight loss, fever, chills, fatigue, weakness. Eyes: Negative for vision changes.  ENT: Negative for hoarseness, difficulty swallowing , nasal congestion. CV: Negative for chest pain, angina, palpitations, dyspnea on exertion, peripheral edema.  Respiratory: Negative for dyspnea at rest, dyspnea on exertion, cough, sputum, wheezing.  GI: See history of present illness. GU:  Negative for dysuria, hematuria, urinary incontinence, urinary frequency, nocturnal urination.  MS: Negative for joint pain, low back pain.  Derm: Negative for rash or itching.  Neuro: Negative for weakness, abnormal sensation, seizure, frequent headaches, memory loss, confusion.  Psych: Negative for anxiety, depression, suicidal ideation, hallucinations.  Endo: Negative for unusual weight change.  Heme: Negative for bruising or bleeding. Allergy: Negative for rash or hives.    Physical Examination:  BP (!) 146/86   Pulse 81   Temp 97.9 F (36.6 C) (Oral)   Ht '5\' 7"'$  (1.702 m)   Wt 198 lb 9.6 oz (90.1 kg)   BMI 31.11 kg/m    General: Well-nourished,  well-developed in no acute distress.  Head: Normocephalic, atraumatic.   Eyes: Conjunctiva pink, no icterus. Mouth: Oropharyngeal mucosa moist and pink , no lesions erythema or exudate. Neck: Supple without thyromegaly, masses, or lymphadenopathy.  Lungs: Clear to auscultation bilaterally.  Heart: Regular rate and rhythm, no murmurs rubs or gallops.  Abdomen: Bowel sounds are normal, nontender, nondistended, no hepatosplenomegaly or masses, no abdominal bruits or    hernia , no rebound or guarding.   Rectal: deferred Extremities: No lower extremity edema. No clubbing or deformities.  Neuro: Alert and oriented x 4 , grossly normal neurologically.  Skin:  Warm and dry, no rash or jaundice.   Psych: Alert and cooperative, normal mood and affect.

## 2015-11-23 NOTE — Assessment & Plan Note (Signed)
65 year old gentleman presenting to schedule surveillance colonoscopy for personal history of adenomatous colon polyps, family history of colon cancer (mother age 56 at diagnosis). Patient's last colonoscopy was in May 2012 at which time he had a 4 mm sessile polyp removed, polyp cannot be retrieved for evaluation. He also had random colon biopsies which were negative. Patient has chronic diarrhea in the setting of diabetes, metformin use. Interesting history of multiple perirectal abscesses requiring I&D over the years. Differential diagnosis includes IBS-D, medication related, diabetic enteropathy, chronic pancreatic exocrine insufficiency, celiac disease. Plan on screening for celiac disease. Colonoscopy in the near future, deep sedation in the OR given polypharmacy.  I have discussed the risks, alternatives, benefits with regards to but not limited to the risk of reaction to medication, bleeding, infection, perforation and the patient is agreeable to proceed. Written consent to be obtained.

## 2015-12-12 NOTE — Patient Instructions (Signed)
Javier Cook  12/12/2015     @PREFPERIOPPHARMACY @   Your procedure is scheduled on  12/19/2015   Report to [facility] at  900  A.M.  Call this number if you have problems the morning of surgery:  [phone]   Remember:  Do not eat food or drink liquids after midnight.  Take these medicines the morning of surgery with A SIP OF WATER  Xanax, elavil, cymbalta, prilosec. Take 1/2 of your usual insulin dosage the night before your procedure. DO NOT take any medications for diabetes the morning of your procedure.   Do not wear jewelry, make-up or nail polish.  Do not wear lotions, powders, or perfumes, or deoderant.  Do not shave 48 hours prior to surgery.  Men may shave face and neck.  Do not bring valuables to the hospital.  [facility] is not responsible for any belongings or valuables.  Contacts, dentures or bridgework may not be worn into surgery.  Leave your suitcase in the car.  After surgery it may be brought to your room.  For patients admitted to the hospital, discharge time will be determined by your treatment team.  Patients discharged the day of surgery will not be allowed to drive home.   Name and phone number of your driver:   Family    Special instructions:  Follow the diet and prep instructions given to you by Dr Oneida Alar office.  Please read over the following fact sheets that you were given. Anesthesia Post-op Instructions and Care and Recovery After Surgery       Colonoscopy A colonoscopy is an exam to look at the entire large intestine (colon). This exam can help find problems such as tumors, polyps, inflammation, and areas of bleeding. The exam takes about 1 hour.  LET [facility] CARE PROVIDER KNOW ABOUT:   Any allergies you have.  All medicines you are taking, including vitamins, herbs, eye drops, creams, and over-the-counter medicines.  Previous problems you or members of your family have had with the use of anesthetics.  Any  blood disorders you have.  Previous surgeries you have had.  Medical conditions you have. RISKS AND COMPLICATIONS  Generally, this is a safe procedure. However, as with any procedure, complications can occur. Possible complications include:  Bleeding.  Tearing or rupture of the colon wall.  Reaction to medicines given during the exam.  Infection (rare). BEFORE THE PROCEDURE   Ask your health care provider about changing or stopping your regular medicines.  You may be prescribed an oral bowel prep. This involves drinking a large amount of medicated liquid, starting the day before your procedure. The liquid will cause you to have multiple loose stools until your stool is almost clear or light green. This cleans out your colon in preparation for the procedure.  Do not eat or drink anything else once you have started the bowel prep, unless your health care provider tells you it is safe to do so.  Arrange for someone to drive you home after the procedure. PROCEDURE   You will be given medicine to help you relax (sedative).  You will lie on your side with your knees bent.  A long, flexible tube with a light and camera on the end (colonoscope) will be inserted through the rectum and into the colon. The camera sends video back to a computer screen as it moves through the colon. The colonoscope also releases carbon dioxide gas to  inflate the colon. This helps your health care provider see the area better.  During the exam, your health care provider may take a small tissue sample (biopsy) to be examined under a microscope if any abnormalities are found.  The exam is finished when the entire colon has been viewed. AFTER THE PROCEDURE   Do not drive for 24 hours after the exam.  You may have a small amount of blood in your stool.  You may pass moderate amounts of gas and have mild abdominal cramping or bloating. This is caused by the gas used to inflate your colon during the exam.  Ask  when your test results will be ready and how you will get your results. Make sure you get your test results.   This information is not intended to replace advice given to you by your health care provider. Make sure you discuss any questions you have with your health care provider.   Document Released: 01/26/2000 Document Revised: 11/18/2012 Document Reviewed: 10/05/2012 Elsevier Interactive Patient Education 2016 [employer] Colonoscopy, Care After Refer to this sheet in the next few weeks. These instructions provide you with information on caring for yourself after your procedure. Your health care provider may also give you more specific instructions. Your treatment has been planned according to current medical practices, but problems sometimes occur. Call your health care provider if you have any problems or questions after your procedure. WHAT TO EXPECT AFTER THE PROCEDURE  After your procedure, it is typical to have the following:  A small amount of blood in your stool.  Moderate amounts of gas and mild abdominal cramping or bloating. HOME CARE INSTRUCTIONS  Do not drive, operate machinery, or sign important documents for 24 hours.  You may shower and resume your regular physical activities, but move at a slower pace for the first 24 hours.  Take frequent rest periods for the first 24 hours.  Walk around or put a warm pack on your abdomen to help reduce abdominal cramping and bloating.  Drink enough fluids to keep your urine clear or pale yellow.  You may resume your normal diet as instructed by your health care provider. Avoid heavy or fried foods that are hard to digest.  Avoid drinking alcohol for 24 hours or as instructed by your health care provider.  Only take over-the-counter or prescription medicines as directed by your health care provider.  If a tissue sample (biopsy) was taken during your procedure:  Do not take aspirin or blood thinners for 7 days, or as  instructed by your health care provider.  Do not drink alcohol for 7 days, or as instructed by your health care provider.  Eat soft foods for the first 24 hours. SEEK MEDICAL CARE IF: You have persistent spotting of blood in your stool 2-3 days after the procedure. SEEK IMMEDIATE MEDICAL CARE IF:  You have more than a small spotting of blood in your stool.  You pass large blood clots in your stool.  Your abdomen is swollen (distended).  You have nausea or vomiting.  You have a fever.  You have increasing abdominal pain that is not relieved with medicine.   This information is not intended to replace advice given to you by your health care provider. Make sure you discuss any questions you have with your health care provider.   Document Released: 09/12/2003 Document Revised: 11/18/2012 Document Reviewed: 10/05/2012 Elsevier Interactive Patient Education 2016 [place] Monitored anesthesia care is an anesthesia service  for a medical procedure. Anesthesia is the loss of the ability to feel pain. It is produced by medicines called anesthetics. It may affect a small area of your body (local anesthesia), a large area of your body (regional anesthesia), or your entire body (general anesthesia). The need for monitored anesthesia care depends your procedure, your condition, and the potential need for regional or general anesthesia. It is often provided during procedures where:   General anesthesia may be needed if there are complications. This is because you need special care when you are under general anesthesia.   You will be under local or regional anesthesia. This is so that you are able to have higher levels of anesthesia if needed.   You will receive calming medicines (sedatives). This is especially the case if sedatives are given to put you in a semi-conscious state of relaxation (deep sedation). This is because the amount of sedative needed to produce  this state can be hard to predict. Too much of a sedative can produce general anesthesia. Monitored anesthesia care is performed by one or more health care providers who have special training in all types of anesthesia. You will need to meet with these health care providers before your procedure. During this meeting, they will ask you about your medical history. They will also give you instructions to follow. (For example, you will need to stop eating and drinking before your procedure. You may also need to stop or change medicines you are taking.) During your procedure, your health care providers will stay with you. They will:   Watch your condition. This includes watching your blood pressure, breathing, and level of pain.   Diagnose and treat problems that occur.   Give medicines if they are needed. These may include calming medicines (sedatives) and anesthetics.   Make sure you are comfortable.  Having monitored anesthesia care does not necessarily mean that you will be under anesthesia. It does mean that your health care providers will be able to manage anesthesia if you need it or if it occurs. It also means that you will be able to have a different type of anesthesia than you are having if you need it. When your procedure is complete, your health care providers will continue to watch your condition. They will make sure any medicines wear off before you are allowed to go home.    This information is not intended to replace advice given to you by your health care provider. Make sure you discuss any questions you have with your health care provider.   Document Released: 10/24/2004 Document Revised: 02/18/2014 Document Reviewed: 03/11/2012 Elsevier Interactive Patient Education 2016 [employer] PATIENT INSTRUCTIONS POST-ANESTHESIA  IMMEDIATELY FOLLOWING SURGERY:  Do not drive or operate machinery for the first twenty four hours after surgery.  Do not make any important decisions for  twenty four hours after surgery or while taking narcotic pain medications or sedatives.  If you develop intractable nausea and vomiting or a severe headache please notify your doctor immediately.  FOLLOW-UP:  Please make an appointment with your surgeon as instructed. You do not need to follow up with anesthesia unless specifically instructed to do so.  WOUND CARE INSTRUCTIONS (if applicable):  Keep a dry clean dressing on the anesthesia/puncture wound site if there is drainage.  Once the wound has quit draining you may leave it open to air.  Generally you should leave the bandage intact for twenty four hours unless there is drainage.  If the epidural site drains  for more than 36-48 hours please call the anesthesia department.  QUESTIONS?:  Please feel free to call your physician or the hospital operator if you have any questions, and they will be happy to assist you.

## 2015-12-13 ENCOUNTER — Encounter (HOSPITAL_COMMUNITY)
Admission: RE | Admit: 2015-12-13 | Discharge: 2015-12-13 | Disposition: A | Discharge status: Home / Self Care | Payer: No Typology Code available for payment source | Source: Ambulatory Visit | Attending: Gastroenterology | Admitting: Gastroenterology

## 2015-12-13 ENCOUNTER — Other Ambulatory Visit: Payer: Self-pay

## 2015-12-13 ENCOUNTER — Encounter (HOSPITAL_COMMUNITY): Payer: Self-pay

## 2015-12-13 ENCOUNTER — Telehealth: Payer: Self-pay | Admitting: Gastroenterology

## 2015-12-13 DIAGNOSIS — Z01818 Encounter for other preprocedural examination: Secondary | ICD-10-CM | POA: Insufficient documentation

## 2015-12-13 HISTORY — DX: Sleep apnea, unspecified: G47.30

## 2015-12-13 HISTORY — DX: Unspecified osteoarthritis, unspecified site: M19.90

## 2015-12-13 HISTORY — DX: Unspecified glaucoma: H40.9

## 2015-12-13 HISTORY — DX: Malignant (primary) neoplasm, unspecified: C80.1

## 2015-12-13 HISTORY — DX: Gastro-esophageal reflux disease without esophagitis: K21.9

## 2015-12-13 HISTORY — DX: Pure hypercholesterolemia, unspecified: E78.00

## 2015-12-13 HISTORY — DX: Personal history of other diseases of the respiratory system: Z87.09

## 2015-12-13 LAB — BASIC METABOLIC PANEL
ANION GAP: 9 (ref 5–15)
BUN: 8 mg/dL (ref 6–20)
CALCIUM: 8.9 mg/dL (ref 8.9–10.3)
CO2: 25 mmol/L (ref 22–32)
Chloride: 98 mmol/L — ABNORMAL LOW (ref 101–111)
Creatinine, Ser: 0.7 mg/dL (ref 0.61–1.24)
GFR calc Af Amer: >60 mL/min (ref >60)
GLUCOSE: 266 mg/dL — AB (ref 65–99)
Potassium: 4.4 mmol/L (ref 3.5–5.1)
Sodium: 132 mmol/L — ABNORMAL LOW (ref 135–145)

## 2015-12-13 LAB — CBC WITH DIFFERENTIAL/PLATELET
BASOS ABS: 0 10*3/uL (ref 0.0–0.1)
Basophils Relative: 0 %
EOS PCT: 3 %
Eosinophils Absolute: 0.2 10*3/uL (ref 0.0–0.7)
HEMATOCRIT: 41 % (ref 39.0–52.0)
Hemoglobin: 13.5 g/dL (ref 13.0–17.0)
LYMPHS ABS: 1.9 10*3/uL (ref 0.7–4.0)
LYMPHS PCT: 33 %
MCH: 26.3 pg (ref 26.0–34.0)
MCHC: 32.9 g/dL (ref 30.0–36.0)
MCV: 79.8 fL (ref 78.0–100.0)
MONO ABS: 0.5 10*3/uL (ref 0.1–1.0)
MONOS PCT: 9 %
NEUTROS ABS: 3.2 10*3/uL (ref 1.7–7.7)
Neutrophils Relative %: 55 %
PLATELETS: 189 10*3/uL (ref 150–400)
RBC: 5.14 MIL/uL (ref 4.22–5.81)
RDW: 14.2 % (ref 11.5–15.5)
WBC: 5.7 10*3/uL (ref 4.0–10.5)

## 2015-12-13 MED ORDER — PEG-KCL-NACL-NASULF-NA ASC-C 100 G PO SOLR: 1.0000 | ORAL | 0 refills | Status: DC

## 2015-12-13 NOTE — Telephone Encounter (Signed)
Pt called to say that his prep prescription hasn't been called in yet. His procedure is for 11/7 with SF. He uses [facility] [address]

## 2015-12-13 NOTE — Telephone Encounter (Signed)
Called in and he is aware

## 2015-12-14 ENCOUNTER — Other Ambulatory Visit: Payer: Self-pay

## 2015-12-14 MED ORDER — PEG 3350-KCL-NA BICARB-NACL 420 G PO SOLR: 4000.0000 mL | ORAL | 0 refills | Status: DC

## 2015-12-17 LAB — TISSUE TRANSGLUTAMINASE, IGA

## 2015-12-18 ENCOUNTER — Telehealth: Payer: Self-pay

## 2015-12-18 NOTE — Telephone Encounter (Signed)
Called pt to arrive at [facility] at 7:45am 12/19/15. TCS will be done at 9:15 am. To start drinking prep in the morning at 4:15 am and NPO after 6:15 am.

## 2015-12-19 ENCOUNTER — Ambulatory Visit (HOSPITAL_COMMUNITY): Payer: No Typology Code available for payment source | Admitting: Anesthesiology

## 2015-12-19 ENCOUNTER — Encounter (HOSPITAL_COMMUNITY): Payer: Self-pay | Admitting: *Deleted

## 2015-12-19 ENCOUNTER — Ambulatory Visit (HOSPITAL_COMMUNITY)
Admission: RE | Admit: 2015-12-19 | Discharge: 2015-12-19 | Disposition: A | Discharge status: Home / Self Care | Payer: No Typology Code available for payment source | Source: Ambulatory Visit | Attending: Gastroenterology | Admitting: Gastroenterology

## 2015-12-19 ENCOUNTER — Encounter (HOSPITAL_COMMUNITY)
Admission: RE | Disposition: A | Discharge status: Home / Self Care | Payer: Self-pay | Source: Ambulatory Visit | Attending: Gastroenterology

## 2015-12-19 DIAGNOSIS — K635 Polyp of colon: Secondary | ICD-10-CM | POA: Diagnosis not present

## 2015-12-19 DIAGNOSIS — K219 Gastro-esophageal reflux disease without esophagitis: Secondary | ICD-10-CM | POA: Insufficient documentation

## 2015-12-19 DIAGNOSIS — H409 Unspecified glaucoma: Secondary | ICD-10-CM | POA: Insufficient documentation

## 2015-12-19 DIAGNOSIS — K644 Residual hemorrhoidal skin tags: Secondary | ICD-10-CM | POA: Insufficient documentation

## 2015-12-19 DIAGNOSIS — Z8719 Personal history of other diseases of the digestive system: Secondary | ICD-10-CM | POA: Diagnosis not present

## 2015-12-19 DIAGNOSIS — Z87891 Personal history of nicotine dependence: Secondary | ICD-10-CM | POA: Diagnosis not present

## 2015-12-19 DIAGNOSIS — F329 Major depressive disorder, single episode, unspecified: Secondary | ICD-10-CM | POA: Insufficient documentation

## 2015-12-19 DIAGNOSIS — E7800 Pure hypercholesterolemia, unspecified: Secondary | ICD-10-CM | POA: Insufficient documentation

## 2015-12-19 DIAGNOSIS — M1990 Unspecified osteoarthritis, unspecified site: Secondary | ICD-10-CM | POA: Insufficient documentation

## 2015-12-19 DIAGNOSIS — Z800 Family history of malignant neoplasm of digestive organs: Secondary | ICD-10-CM | POA: Diagnosis not present

## 2015-12-19 DIAGNOSIS — Z7982 Long term (current) use of aspirin: Secondary | ICD-10-CM | POA: Insufficient documentation

## 2015-12-19 DIAGNOSIS — G4730 Sleep apnea, unspecified: Secondary | ICD-10-CM | POA: Diagnosis not present

## 2015-12-19 DIAGNOSIS — D123 Benign neoplasm of transverse colon: Secondary | ICD-10-CM

## 2015-12-19 DIAGNOSIS — Z794 Long term (current) use of insulin: Secondary | ICD-10-CM | POA: Insufficient documentation

## 2015-12-19 DIAGNOSIS — R197 Diarrhea, unspecified: Secondary | ICD-10-CM | POA: Diagnosis present

## 2015-12-19 DIAGNOSIS — Z79899 Other long term (current) drug therapy: Secondary | ICD-10-CM | POA: Insufficient documentation

## 2015-12-19 DIAGNOSIS — Z86010 Personal history of colonic polyps: Secondary | ICD-10-CM | POA: Diagnosis not present

## 2015-12-19 DIAGNOSIS — Z96652 Presence of left artificial knee joint: Secondary | ICD-10-CM | POA: Diagnosis not present

## 2015-12-19 DIAGNOSIS — E119 Type 2 diabetes mellitus without complications: Secondary | ICD-10-CM | POA: Insufficient documentation

## 2015-12-19 DIAGNOSIS — Q438 Other specified congenital malformations of intestine: Secondary | ICD-10-CM | POA: Insufficient documentation

## 2015-12-19 HISTORY — PX: POLYPECTOMY: SHX5525

## 2015-12-19 HISTORY — PX: BIOPSY: SHX5522

## 2015-12-19 HISTORY — PX: COLONOSCOPY WITH PROPOFOL: SHX5780

## 2015-12-19 LAB — GLUCOSE, CAPILLARY
GLUCOSE-CAPILLARY: 132 mg/dL — AB (ref 65–99)
Glucose-Capillary: 97 mg/dL (ref 65–99)

## 2015-12-19 SURGERY — COLONOSCOPY WITH PROPOFOL
Anesthesia: Monitor Anesthesia Care

## 2015-12-19 MED ORDER — MIDAZOLAM HCL 2 MG/2ML IJ SOLN
INTRAMUSCULAR | Status: AC
  Filled 2015-12-19: qty 2

## 2015-12-19 MED ORDER — MIDAZOLAM HCL 2 MG/2ML IJ SOLN
1.0000 mg | INTRAMUSCULAR | Status: DC | PRN
  Administered 2015-12-19 (×3): 2 mg via INTRAVENOUS
  Filled 2015-12-19 (×2): qty 2

## 2015-12-19 MED ORDER — PROPOFOL 500 MG/50ML IV EMUL
INTRAVENOUS | Status: DC | PRN
  Administered 2015-12-19: 125 ug/kg/min via INTRAVENOUS
  Administered 2015-12-19: 11:00:00 via INTRAVENOUS

## 2015-12-19 MED ORDER — LACTATED RINGERS IV SOLN
INTRAVENOUS | Status: DC
  Administered 2015-12-19: 1000 mL via INTRAVENOUS

## 2015-12-19 MED ORDER — FENTANYL CITRATE (PF) 100 MCG/2ML IJ SOLN
INTRAMUSCULAR | Status: AC
  Filled 2015-12-19: qty 2

## 2015-12-19 MED ORDER — CHLORHEXIDINE GLUCONATE CLOTH 2 % EX PADS: 6.0000 | MEDICATED_PAD | Freq: Once | CUTANEOUS | Status: DC

## 2015-12-19 MED ORDER — FENTANYL CITRATE (PF) 100 MCG/2ML IJ SOLN
25.0000 ug | INTRAMUSCULAR | Status: AC | PRN
  Administered 2015-12-19 (×2): 25 ug via INTRAVENOUS

## 2015-12-19 NOTE — Anesthesia Preprocedure Evaluation (Signed)
Anesthesia Evaluation  Patient identified by MRN, date of birth, ID band Patient awake    Reviewed: Allergy & Precautions, NPO status , Patient's Chart, lab work & pertinent test results  Airway Mallampati: II  TM Distance: >3 FB     Dental  (+) Teeth Intact   Pulmonary sleep apnea , former smoker,    breath sounds clear to auscultation       Cardiovascular negative cardio ROS   Rhythm:Regular Rate:Normal     Neuro/Psych PSYCHIATRIC DISORDERS (PTSD) Depression    GI/Hepatic GERD  Controlled and Medicated,  Endo/Other  diabetes, Type 2, Oral Hypoglycemic Agents  Renal/GU      Musculoskeletal   Abdominal   Peds  Hematology   Anesthesia Other Findings   Reproductive/Obstetrics                             Anesthesia Physical Anesthesia Plan  ASA: III  Anesthesia Plan: MAC   Post-op Pain Management:    Induction: Intravenous  Airway Management Planned: Simple Face Mask  Additional Equipment:   Intra-op Plan:   Post-operative Plan:   Informed Consent: I have reviewed the patients History and Physical, chart, labs and discussed the procedure including the risks, benefits and alternatives for the proposed anesthesia with the patient or authorized representative who has indicated his/her understanding and acceptance.     Plan Discussed with:   Anesthesia Plan Comments:         Anesthesia Quick Evaluation

## 2015-12-19 NOTE — Transfer of Care (Signed)
Immediate Anesthesia Transfer of Care Note  Patient: Javier Cook  Procedure(s) Performed: Procedure(s) with comments: COLONOSCOPY WITH PROPOFOL (N/A) - 1015 - moved to 9:15 - pt knows to arrive at 7:45   Patient Location: PACU  Anesthesia Type:MAC  Level of Consciousness: sedated and patient cooperative  Airway & Oxygen Therapy: Patient Spontanous Breathing and non-rebreather face mask  Post-op Assessment: Report given to RN and Post -op Vital signs reviewed and stable  Post vital signs: Reviewed and stable  Last Vitals:  Vitals:   12/19/15 1000 12/19/15 1005  BP: 122/71 124/73  Pulse:    Resp: 20 (!) 22  Temp:      Last Pain:  Vitals:   12/19/15 0848  TempSrc: Oral      Patients Stated Pain Goal: 6 ([id number])  Complications: No apparent anesthesia complications

## 2015-12-19 NOTE — Discharge Instructions (Signed)
You had 1 polyp removed. You have MODERATE internal hemorrhoids.   CONTINUE YOUR WEIGHT LOSS EFFORTS. LOSE TEN POUNDS.  DRINK WATER TO KEEP YOUR URINE LIGHT YELLOW.  FOLLOW A HIGH FIBER/DIABETIC DIET. AVOID ITEMS THAT CAUSE BLOATING & GAS. SEE INFO BELOW.  YOUR BIOPSY RESULTS WILL BE AVAILABLE IN MY CHART AFTER NOV 10 AND MY OFFICE WILL CONTACT YOU IN 10-14 DAYS WITH YOUR RESULTS.   I SPOKE WITH DR. Laurann Montana. He agrees we should hold your Glucophage for one month to see if your loose stools/diarrhea gets better. PLEASE CALLIME WITH QUESTIONS OR CONCERNS REGARDING YOUR BLOOD SUGAR.  PLEASE CALL DR. Maribel Luis IN ONE MONTH WITH AN UPDATE ON YOUR STOOLS.  FOLLOW UP IN 4 MOS.   Next colonoscopy in 5 years.    Colonoscopy Care After Read the instructions outlined below and refer to this sheet in the next week. These discharge instructions provide you with general information on caring for yourself after you leave the hospital. While your treatment has been planned according to the most current medical practices available, unavoidable complications occasionally occur. If you have any problems or questions after discharge, call DR. Huyen Perazzo, [phone].  ACTIVITY  You may resume your regular activity, but move at a slower pace for the next 24 hours.   Take frequent rest periods for the next 24 hours.   Walking will help get rid of the air and reduce the bloated feeling in your belly (abdomen).   No driving for 24 hours (because of the medicine (anesthesia) used during the test).   You may shower.   Do not sign any important legal documents or operate any machinery for 24 hours (because of the anesthesia used during the test).    NUTRITION  Drink plenty of fluids.   You may resume your normal diet as instructed by your doctor.   Begin with a light meal and progress to your normal diet. Heavy or fried foods are harder to digest and may make you feel sick to your stomach (nauseated).     Avoid alcoholic beverages for 24 hours or as instructed.    MEDICATIONS  You may resume your normal medications.   WHAT YOU CAN EXPECT TODAY  Some feelings of bloating in the abdomen.   Passage of more gas than usual.   Spotting of blood in your stool or on the toilet paper  .  IF YOU HAD POLYPS REMOVED DURING THE COLONOSCOPY:  Eat a soft diet IF YOU HAVE NAUSEA, BLOATING, ABDOMINAL PAIN, OR VOMITING.    FINDING OUT THE RESULTS OF YOUR TEST Not all test results are available during your visit. DR. Oneida Alar WILL CALL YOU WITHIN 14 DAYS OF YOUR PROCEDUE WITH YOUR RESULTS. Do not assume everything is normal if you have not heard from DR. Aphrodite Harpenau, CALL HER OFFICE AT [phone].  SEEK IMMEDIATE MEDICAL ATTENTION AND CALL THE OFFICE: [phone] IF:  You have more than a spotting of blood in your stool.   Your belly is swollen (abdominal distention).   You are nauseated or vomiting.   You have a temperature over 101F.   You have abdominal pain or discomfort that is severe or gets worse throughout the day.   High-Fiber Diet A high-fiber diet changes your normal diet to include more whole grains, legumes, fruits, and vegetables. Changes in the diet involve replacing refined carbohydrates with unrefined foods. The calorie level of the diet is essentially unchanged. The Dietary Reference Intake (recommended amount) for adult males is 38 grams  per day. For adult females, it is 25 grams per day. Pregnant and lactating women should consume 28 grams of fiber per day. Fiber is the intact part of a plant that is not broken down during digestion. Functional fiber is fiber that has been isolated from the plant to provide a beneficial effect in the body. PURPOSE  Increase stool bulk.   Ease and regulate bowel movements.   Lower cholesterol.   REDUCE RISK OF COLON CANCER  INDICATIONS THAT YOU NEED MORE FIBER  Constipation and hemorrhoids.   Uncomplicated diverticulosis  (intestine condition) and irritable bowel syndrome.   Weight management.   As a protective measure against hardening of the arteries (atherosclerosis), diabetes, and cancer.   GUIDELINES FOR INCREASING FIBER IN THE DIET  Start adding fiber to the diet slowly. A gradual increase of about 5 more grams (2 slices of whole-wheat bread, 2 servings of most fruits or vegetables, or 1 bowl of high-fiber cereal) per day is best. Too rapid an increase in fiber may result in constipation, flatulence, and bloating.   Drink enough water and fluids to keep your urine clear or pale yellow. Water, juice, or caffeine-free drinks are recommended. Not drinking enough fluid may cause constipation.   Eat a variety of high-fiber foods rather than one type of fiber.   Try to increase your intake of fiber through using high-fiber foods rather than fiber pills or supplements that contain small amounts of fiber.   The goal is to change the types of food eaten. Do not supplement your present diet with high-fiber foods, but replace foods in your present diet.   INCLUDE A VARIETY OF FIBER SOURCES  Replace refined and processed grains with whole grains, canned fruits with fresh fruits, and incorporate other fiber sources. White rice, white breads, and most bakery goods contain little or no fiber.   Sandner whole-grain rice, buckwheat oats, and many fruits and vegetables are all good sources of fiber. These include: broccoli, Brussels sprouts, cabbage, cauliflower, beets, sweet potatoes, white potatoes (skin on), carrots, tomatoes, eggplant, squash, berries, fresh fruits, and dried fruits.   Cereals appear to be the richest source of fiber. Cereal fiber is found in whole grains and bran. Bran is the fiber-rich outer coat of cereal grain, which is largely removed in refining. In whole-grain cereals, the bran remains. In breakfast cereals, the largest amount of fiber is found in those with "bran" in their names. The fiber  content is sometimes indicated on the label.   You may need to include additional fruits and vegetables each day.   In baking, for 1 cup white flour, you may use the following substitutions:   1 cup whole-wheat flour minus 2 tablespoons.   1/2 cup white flour plus 1/2 cup whole-wheat flour.   Polyps, Colon  A polyp is extra tissue that grows inside your body. Colon polyps grow in the large intestine. The large intestine, also called the colon, is part of your digestive system. It is a long, hollow tube at the end of your digestive tract where your body makes and stores stool. Most polyps are not dangerous. They are benign. This means they are not cancerous. But over time, some types of polyps can turn into cancer. Polyps that are smaller than a pea are usually not harmful. But larger polyps could someday become or may already be cancerous. To be safe, doctors remove all polyps and test them.   PREVENTION There is not one sure way to prevent polyps. You  might be able to lower your risk of getting them if you:  Eat more fruits and vegetables and less fatty food.   Do not smoke.   Avoid alcohol.   Exercise every day.   Lose weight if you are overweight.   Eating more calcium and folate can also lower your risk of getting polyps. Some foods that are rich in calcium are milk, cheese, and broccoli. Some foods that are rich in folate are chickpeas, kidney beans, and spinach.   Hemorrhoids Hemorrhoids are dilated (enlarged) veins around the rectum. Sometimes clots will form in the veins. This makes them swollen and painful. These are called thrombosed hemorrhoids. Causes of hemorrhoids include:  Constipation.   Straining to have a bowel movement.   HEAVY LIFTING  HOME CARE INSTRUCTIONS  Eat a well balanced diet and drink 6 to 8 glasses of water every day to avoid constipation. You may also use a bulk laxative.   Avoid straining to have bowel movements.   Keep anal area dry and  clean.   Do not use a donut shaped pillow or sit on the toilet for long periods. This increases blood pooling and pain.   Move your bowels when your body has the urge; this will require less straining and will decrease pain and pressure.

## 2015-12-19 NOTE — Anesthesia Postprocedure Evaluation (Signed)
Anesthesia Post Note  Patient: Javier Cook  Procedure(s) Performed: Procedure(s) (LRB): COLONOSCOPY WITH PROPOFOL (N/A)  Patient location during evaluation: PACU Anesthesia Type: MAC Level of consciousness: awake Pain management: satisfactory to patient Vital Signs Assessment: post-procedure vital signs reviewed and stable Respiratory status: spontaneous breathing Cardiovascular status: stable Anesthetic complications: no    Last Vitals:  Vitals:   12/19/15 1115 12/19/15 1135  BP: 137/83 129/70  Pulse: 68 61  Resp: 20 18  Temp:  36.6 C    Last Pain:  Vitals:   12/19/15 1135  TempSrc: Oral                 Alphonsine Minium

## 2015-12-19 NOTE — H&P (Signed)
Primary Care Physician:  Irven Shelling, MD Primary Gastroenterologist:  Dr. Oneida Alar  Pre-Procedure History & Physical: HPI:  Javier Cook is a 65 y.o. male here for  [place]. PMHX; DIABETES.  Past Medical History:  Diagnosis Date  . Arthritis   . Cancer ([place])    basal cell of skin  . Depression   . Diabetes ([place])   . GERD (gastroesophageal reflux disease)   . Glaucoma   . History of ARDS 2007  . Hypercholesteremia   . Perirectal abscess    multiple I+D  . Post traumatic stress disorder   . Sleep apnea     Past Surgical History:  Procedure Laterality Date  . APPENDECTOMY  2003   incidental appendectomy at time of surgery for "ruptured abscess in right lower abdomen" per patient. patient developed ARDS/sepsis.  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 INCISION AND DRAINAGE PERIRECTAL ABSCESS     multiple  . KNEE ARTHROSCOPY Left 2007  . REPLACEMENT TOTAL KNEE Left 2007  . SKIN CANCER EXCISION      Prior to Admission medications   Medication Sig Start Date End Date Taking? Authorizing Provider  ALPRAZolam Duanne Moron) 1 MG tablet Take 0.5 mg by mouth 2 (two) times daily.   Yes Historical Provider, MD  amitriptyline (ELAVIL) 10 MG tablet Take 10 mg by mouth 2 (two) times daily.   Yes Historical Provider, MD  aspirin EC 81 MG tablet Take 81 mg by mouth daily.   Yes Historical Provider, MD  Cholecalciferol (VITAMIN D3) 1000 units CAPS Take 1 capsule by mouth daily.   Yes Historical Provider, MD  DULoxetine (CYMBALTA) 20 MG capsule Take 60 mg by mouth daily.   Yes Historical Provider, MD  glipiZIDE (GLUCOTROL) 5 MG tablet Take 2.5 mg by mouth daily before breakfast.   Yes Historical Provider, MD  insulin glargine (LANTUS) 100 UNIT/ML injection Inject 44 Units into the skin daily.   Yes Historical Provider, MD  Melatonin 3 MG TABS Take 1 tablet by mouth at bedtime.    Yes Historical Provider, MD   metFORMIN (GLUCOPHAGE) 1000 MG tablet Take 1,000 mg by mouth 2 (two) times daily.   Yes Historical Provider, MD  omeprazole (PRILOSEC) 20 MG capsule Take 20 mg by mouth daily.   Yes Historical Provider, MD  polyethylene glycol-electrolytes (TRILYTE) 420 g solution Take 4,000 mLs by mouth as directed. 12/14/15  Yes Daneil Dolin, MD  QUEtiapine (SEROQUEL) 25 MG tablet Take 25 mg by mouth at bedtime.   Yes Historical Provider, MD  simvastatin (ZOCOR) 40 MG tablet Take 80 mg by mouth daily.    Yes Historical Provider, MD  timolol (BETIMOL) 0.5 % ophthalmic solution Place 1 drop into the left eye 2 (two) times daily.   Yes Historical Provider, MD  UNABLE TO FIND CPAP 16 at night.   Yes Historical Provider, MD  zolpidem (AMBIEN) 10 MG tablet Take 10 mg by mouth at bedtime.   Yes Historical Provider, MD  peg 3350 powder (MOVIPREP) 100 g SOLR Take 1 kit (200 g total) by mouth as directed. 12/13/15   Danie Binder, MD    Allergies as of 11/23/2015  . (No Known Allergies)    Family History  Problem Relation Age of Onset  . Colon cancer Mother 57    77, metastatic colon cancer  . Melanoma Sister     Social History   Social History  .  Marital status: Married    Spouse name: N/A  . Number of children: N/A  . Years of education: N/A   Occupational History  . Not on file.   Social History Main Topics  . Smoking status: Former Smoker    Packs/day: 1.00    Years: 10.00    Types: Cigarettes  . Smokeless tobacco: Current User    Types: Chew     Comment: 1 can week  . Alcohol use No  . Drug use: No  . Sexual activity: Yes    Birth control/ protection: None   Other Topics Concern  . Not on file   Social History Narrative   [employer]    Review of Systems: See HPI, otherwise negative ROS   Physical Exam: BP 135/77   Pulse 77   Temp 97.9 F (36.6 C) (Oral)   Resp 20   SpO2 94%  General:   Alert,  pleasant and cooperative in NAD Head:  Normocephalic and atraumatic. Neck:   Supple; Lungs:  Clear throughout to auscultation.    Heart:  Regular rate and rhythm. Abdomen:  Soft, nontender and nondistended. Normal bowel sounds, without guarding, and without rebound.   Neurologic:  Alert and  oriented x4;  grossly normal neurologically.  Impression/Plan:      Diarrhea  PLAN: TCS TODAY WITH BIOPSY

## 2015-12-19 NOTE — Op Note (Signed)
[facility] Patient Name: Javier Cook Procedure Date: 12/19/2015 9:03 AM MRN: [id number] Date of Birth: 1950/06/06 Attending MD: Barney Drain , MD CSN: [id number] Age: 65 Admit Type: Outpatient Procedure:                Colonoscopy WITH COLD FORCEPS BIOPSY/POLYPECTOMY Indications:              High risk colon cancer surveillance: Personal                            history of colonic polyps, Incidental change in                            bowel habits noted. LAST TCS 2012 RANDOM COLD                            BIOPSIES: NORMAL. PT REPORTS LOOSE STOOLS. Providers:                Barney Drain, MD, Janeece Riggers, RN, Charlyne Petrin                            RN, RN, Randa Spike, Technician, Jeanann Lewandowsky. Sharon Seller, RN, Isabella Stalling, Technician Referring MD:             Lavone Orn Medicines:                Propofol per Anesthesia Complications:            No immediate complications. Estimated Blood Loss:     Estimated blood loss was minimal. Procedure:                Pre-Anesthesia Assessment:                           - Prior to the procedure, a History and Physical                            was performed, and patient medications and                            allergies were reviewed. The patient's tolerance of                            previous anesthesia was also reviewed. The risks                            and benefits of the procedure and the sedation                            options and risks were discussed with the patient.                            All questions were answered, and informed consent  was obtained. Prior Anticoagulants: The patient has                            taken aspirin, last dose was day of procedure. ASA                            Grade Assessment: II - A patient with mild systemic                            disease. After reviewing the risks and benefits,                            the  patient was deemed in satisfactory condition to                            undergo the procedure. After obtaining informed                            consent, the colonoscope was passed under direct                            vision. Throughout the procedure, the patient's                            blood pressure, pulse, and oxygen saturations were                            monitored continuously. The EC-[id number]                            scope was introduced through the anus and advanced                            to the 10 cm into the ileum. The EC-[id number]                            [id number] scope was introduced through the and                            advanced to the. The terminal ileum, ileocecal                            valve, appendiceal orifice, and rectum were                            photographed. The colonoscopy was somewhat                            difficult due to a tortuous colon. Successful                            completion of the procedure was aided by COLOWRAP.  The patient tolerated the procedure well. The                            quality of the bowel preparation was excellent. Scope In: 10:34:38 AM Scope Out: 10:56:28 AM Scope Withdrawal Time: 0 hours 18 minutes 17 seconds  Total Procedure Duration: 0 hours 21 minutes 50 seconds  Findings:      The terminal ileum appeared normal.      A 3 to 4 mm polyp was found in the proximal transverse colon. The polyp       was sessile. The polyp was removed with a cold biopsy forceps. Resection       and retrieval were complete.      The recto-sigmoid colon and sigmoid colon were mildly redundant.       Biopsies for histology were taken with a cold forceps from the cecum,       ascending colon, transverse colon, descending colon and sigmoid colon       for evaluation of microscopic colitis.      External hemorrhoids were found. The hemorrhoids were moderate. Impression:                - NO SOURCE FOR DIARRHEA IDENTIFIED                           - One 3 to 4 mm polyp in the proximal transverse                            colon, removed with a cold biopsy forceps. Resected                            and retrieved.                           - SLIGHTLY Redundant LEFT colon.                           - Non-bleeding internal hemorrhoids. Moderate Sedation:      Per Anesthesia Care Recommendation:           - Diabetic (ADA) diet.                           - Continue present medications. HOLD glucophage for                            one month to see if diarrhea improved. DISCUSSED                            WITH DR. Laurann Montana.                           - Await pathology results.                           - Repeat colonoscopy in 5 years for surveillance                            DUE TO PERSONAL  HISTORY OF POLYPS AND FIRST DEGREE                            RELATIVE WITH COLON CANCER AGE > 60..                           - Return to GI office in 4 months.                           - Patient has a contact number available for                            emergencies. The signs and symptoms of potential                            delayed complications were discussed with the                            patient. Return to normal activities tomorrow.                            Written discharge instructions were provided to the                            patient. Procedure Code(s):        --- Professional ---                           [phone], Colonoscopy, flexible; with biopsy, single                            or multiple Diagnosis Code(s):        --- Professional ---                           Z86.010, Personal history of colonic polyps                           K64.8, Other hemorrhoids                           D12.3, Benign neoplasm of transverse colon (hepatic                            flexure or splenic flexure)                           Q43.8, Other specified congenital  malformations of                            intestine CPT copyright 2016 American [employer]. All rights reserved. The codes documented in this report are preliminary and upon coder review may  be revised to meet current compliance requirements. Barney Drain, MD Barney Drain, MD 12/19/2015 11:22:48 AM This report has been signed electronically. Number of Addenda: 0

## 2015-12-22 ENCOUNTER — Encounter (HOSPITAL_COMMUNITY): Payer: Self-pay | Admitting: Gastroenterology

## 2016-01-01 NOTE — Progress Notes (Signed)
TTG NOTED.

## 2016-01-02 ENCOUNTER — Telehealth: Payer: Self-pay | Admitting: Gastroenterology

## 2016-01-02 NOTE — Telephone Encounter (Addendum)
Please call pt. He had ONE HYPERPLASTIC POLYP removed. HIS colon biopsies are normal.   CONTINUE YOUR WEIGHT LOSS EFFORTS. LOSE TEN POUNDS.  DRINK WATER TO KEEP YOUR URINE LIGHT YELLOW.  FOLLOW A HIGH FIBER/DIABETIC DIET. AVOID ITEMS THAT CAUSE BLOATING & GAS.   PLEASE CALL DR. Meaghann Choo IN ONE MONTH WITH AN UPDATE ON YOUR STOOLS.  FOLLOW UP IN 4 MOS E30 DIARRHEA.   Next colonoscopy in 5 years.

## 2016-01-03 NOTE — Telephone Encounter (Signed)
OV made and reminder in epic °

## 2016-01-03 NOTE — Telephone Encounter (Signed)
Pt is aware.  

## 2016-01-03 NOTE — Progress Notes (Signed)
Pt is aware.  

## 2016-03-28 ENCOUNTER — Telehealth: Payer: Self-pay | Admitting: Gastroenterology

## 2016-03-28 NOTE — Telephone Encounter (Signed)
[phone]  Patient wife called and stated that her husbands tcs was billed to medicare and not [employer]. Will you please give then a call

## 2016-03-29 NOTE — Telephone Encounter (Signed)
Patient's wife stated that his claim should be filed with the [place] and not Medicare

## 2016-03-29 NOTE — Telephone Encounter (Signed)
I sent an email to the billing department to follow-up on this

## 2016-03-29 NOTE — Telephone Encounter (Signed)
Patient made aware that we are working with the billing department on this

## 2016-04-04 NOTE — Telephone Encounter (Signed)
I spoke with the billing department and please see response below:    [facility] s.w Lucita Lora 04/04/2016 she stated the dx on the claim needs to match what is on the authorization exactly.  The DX on the auth is K63.5 .   The DX on the claim is z86.010 and D12.3 for the 12/19/15 DOS.  The 11/23/15 DOS shows DX of Z86.010, Z80.0 and K52.9.

## 2016-04-04 NOTE — Telephone Encounter (Signed)
Patient made aware and said that he will contact the [place] and ask them to change the authorization.

## 2016-04-04 NOTE — Telephone Encounter (Signed)
The [facility] should change their authorization Dx code to match ours Z86.010

## 2016-04-04 NOTE — Telephone Encounter (Signed)
I tried to call the patient, no answer,lmom 

## 2016-04-09 NOTE — Telephone Encounter (Signed)
I received a voicemail from the patient and I tried to call her back today, no answer, lmom

## 2016-04-17 ENCOUNTER — Ambulatory Visit: Payer: Medicare Other | Admitting: Gastroenterology

## 2016-06-07 DIAGNOSIS — Z7984 Long term (current) use of oral hypoglycemic drugs: Secondary | ICD-10-CM | POA: Diagnosis not present

## 2016-06-07 DIAGNOSIS — Z119 Encounter for screening for infectious and parasitic diseases, unspecified: Secondary | ICD-10-CM | POA: Diagnosis not present

## 2016-06-07 DIAGNOSIS — E538 Deficiency of other specified B group vitamins: Secondary | ICD-10-CM | POA: Diagnosis not present

## 2016-06-07 DIAGNOSIS — Z0000 Encounter for general adult medical examination without abnormal findings: Secondary | ICD-10-CM | POA: Diagnosis not present

## 2016-06-07 DIAGNOSIS — F325 Major depressive disorder, single episode, in full remission: Secondary | ICD-10-CM | POA: Diagnosis not present

## 2016-06-07 DIAGNOSIS — Z794 Long term (current) use of insulin: Secondary | ICD-10-CM | POA: Diagnosis not present

## 2016-06-07 DIAGNOSIS — E1165 Type 2 diabetes mellitus with hyperglycemia: Secondary | ICD-10-CM | POA: Diagnosis not present

## 2016-06-07 DIAGNOSIS — G4733 Obstructive sleep apnea (adult) (pediatric): Secondary | ICD-10-CM | POA: Diagnosis not present

## 2016-06-07 DIAGNOSIS — G4700 Insomnia, unspecified: Secondary | ICD-10-CM | POA: Diagnosis not present

## 2016-06-07 DIAGNOSIS — E1142 Type 2 diabetes mellitus with diabetic polyneuropathy: Secondary | ICD-10-CM | POA: Diagnosis not present

## 2016-06-07 DIAGNOSIS — E7800 Pure hypercholesterolemia, unspecified: Secondary | ICD-10-CM | POA: Diagnosis not present

## 2016-09-19 DIAGNOSIS — E1142 Type 2 diabetes mellitus with diabetic polyneuropathy: Secondary | ICD-10-CM | POA: Diagnosis not present

## 2016-09-19 DIAGNOSIS — Z794 Long term (current) use of insulin: Secondary | ICD-10-CM | POA: Diagnosis not present

## 2016-09-19 DIAGNOSIS — Z7984 Long term (current) use of oral hypoglycemic drugs: Secondary | ICD-10-CM | POA: Diagnosis not present

## 2017-06-12 DIAGNOSIS — F325 Major depressive disorder, single episode, in full remission: Secondary | ICD-10-CM | POA: Diagnosis not present

## 2017-06-12 DIAGNOSIS — J42 Unspecified chronic bronchitis: Secondary | ICD-10-CM | POA: Diagnosis not present

## 2017-06-12 DIAGNOSIS — K219 Gastro-esophageal reflux disease without esophagitis: Secondary | ICD-10-CM | POA: Diagnosis not present

## 2017-06-12 DIAGNOSIS — E7800 Pure hypercholesterolemia, unspecified: Secondary | ICD-10-CM | POA: Diagnosis not present

## 2017-06-12 DIAGNOSIS — E1142 Type 2 diabetes mellitus with diabetic polyneuropathy: Secondary | ICD-10-CM | POA: Diagnosis not present

## 2017-06-12 DIAGNOSIS — Z0000 Encounter for general adult medical examination without abnormal findings: Secondary | ICD-10-CM | POA: Diagnosis not present

## 2017-06-12 DIAGNOSIS — F329 Major depressive disorder, single episode, unspecified: Secondary | ICD-10-CM | POA: Diagnosis not present

## 2017-06-12 DIAGNOSIS — Z1389 Encounter for screening for other disorder: Secondary | ICD-10-CM | POA: Diagnosis not present

## 2017-06-12 DIAGNOSIS — G4733 Obstructive sleep apnea (adult) (pediatric): Secondary | ICD-10-CM | POA: Diagnosis not present

## 2017-06-12 DIAGNOSIS — G4700 Insomnia, unspecified: Secondary | ICD-10-CM | POA: Diagnosis not present

## 2017-07-14 DIAGNOSIS — J42 Unspecified chronic bronchitis: Secondary | ICD-10-CM | POA: Diagnosis not present

## 2017-08-19 DIAGNOSIS — L723 Sebaceous cyst: Secondary | ICD-10-CM | POA: Diagnosis not present

## 2017-11-06 DIAGNOSIS — L821 Other seborrheic keratosis: Secondary | ICD-10-CM | POA: Diagnosis not present

## 2017-12-24 DIAGNOSIS — E1142 Type 2 diabetes mellitus with diabetic polyneuropathy: Secondary | ICD-10-CM | POA: Diagnosis not present

## 2017-12-24 DIAGNOSIS — K219 Gastro-esophageal reflux disease without esophagitis: Secondary | ICD-10-CM | POA: Diagnosis not present

## 2017-12-24 DIAGNOSIS — R251 Tremor, unspecified: Secondary | ICD-10-CM | POA: Diagnosis not present

## 2018-06-23 DIAGNOSIS — R69 Illness, unspecified: Secondary | ICD-10-CM | POA: Diagnosis not present

## 2018-06-23 DIAGNOSIS — E7800 Pure hypercholesterolemia, unspecified: Secondary | ICD-10-CM | POA: Diagnosis not present

## 2018-06-23 DIAGNOSIS — Z1389 Encounter for screening for other disorder: Secondary | ICD-10-CM | POA: Diagnosis not present

## 2018-06-23 DIAGNOSIS — G4733 Obstructive sleep apnea (adult) (pediatric): Secondary | ICD-10-CM | POA: Diagnosis not present

## 2018-06-23 DIAGNOSIS — E1142 Type 2 diabetes mellitus with diabetic polyneuropathy: Secondary | ICD-10-CM | POA: Diagnosis not present

## 2018-06-23 DIAGNOSIS — M25511 Pain in right shoulder: Secondary | ICD-10-CM | POA: Diagnosis not present

## 2018-06-23 DIAGNOSIS — K219 Gastro-esophageal reflux disease without esophagitis: Secondary | ICD-10-CM | POA: Diagnosis not present

## 2018-06-23 DIAGNOSIS — G8929 Other chronic pain: Secondary | ICD-10-CM | POA: Diagnosis not present

## 2018-06-23 DIAGNOSIS — R251 Tremor, unspecified: Secondary | ICD-10-CM | POA: Diagnosis not present

## 2018-06-23 DIAGNOSIS — R21 Rash and other nonspecific skin eruption: Secondary | ICD-10-CM | POA: Diagnosis not present

## 2018-09-16 ENCOUNTER — Encounter: Payer: Self-pay | Admitting: Neurology

## 2018-09-16 ENCOUNTER — Ambulatory Visit (INDEPENDENT_AMBULATORY_CARE_PROVIDER_SITE_OTHER): Payer: Medicare HMO | Admitting: Neurology

## 2018-09-16 ENCOUNTER — Telehealth: Payer: Self-pay | Admitting: Neurology

## 2018-09-16 ENCOUNTER — Other Ambulatory Visit: Payer: Self-pay

## 2018-09-16 VITALS — BP 141/77 | HR 73 | Temp 98.4°F | Ht 67.0 in | Wt 196.0 lb

## 2018-09-16 DIAGNOSIS — R5382 Chronic fatigue, unspecified: Secondary | ICD-10-CM

## 2018-09-16 DIAGNOSIS — R269 Unspecified abnormalities of gait and mobility: Secondary | ICD-10-CM | POA: Diagnosis not present

## 2018-09-16 DIAGNOSIS — M6281 Muscle weakness (generalized): Secondary | ICD-10-CM

## 2018-09-16 DIAGNOSIS — G8250 Quadriplegia, unspecified: Secondary | ICD-10-CM | POA: Diagnosis not present

## 2018-09-16 DIAGNOSIS — R531 Weakness: Secondary | ICD-10-CM | POA: Diagnosis not present

## 2018-09-16 DIAGNOSIS — E538 Deficiency of other specified B group vitamins: Secondary | ICD-10-CM | POA: Diagnosis not present

## 2018-09-16 HISTORY — DX: Unspecified abnormalities of gait and mobility: R26.9

## 2018-09-16 NOTE — Telephone Encounter (Signed)
[employer] medicare/[employer] order sent to GI. They obtain the auth and reach out to the patient to schedule.

## 2018-09-16 NOTE — Progress Notes (Signed)
Reason for visit: Tremor  Referring physician: Dr. Karsten Ro is a 68 y.o. male  History of present illness:  [name] is a 68 year old right-handed white male with a 2-year history of some problems with tremor affecting both upper extremities, right slightly worse than the left.  Over the last year however he has also noticed some increasing problems with weakness of all 4 extremities.  He is no longer able to do things that he used to do such as use a crossbow or a compound bow.  He finds that his hands are weak, he has difficulty performing any task that requires significant motor strength.  He has also noted a change in his walking, he is falling more frequently, he is shuffling his feet.  The patient does report that he has a left total knee replacement in the past.  The patient has diabetes, he will note some tingling in the feet and occasional sharp lancinating pains in the right leg.  She does have some right-sided neck discomfort without pain going down the arms on either side.  He reports no change in bowel or bladder function.  He reports that there has been some change in handwriting, he may have some difficulty feeding himself at times.  The patient has also become a bit more forgetful than usual.  He denies any family history of tremor.  He is sent to this office for an evaluation.  He is on cholesterol-lowering agents currently.  Past Medical History:  Diagnosis Date  . Anxiety   . ARDS (adult respiratory distress syndrome) ([place]) 2003  . Arthritis   . Cancer ([place])    basal cell of skin  . Clostridium difficile infection 2003  . Depression   . Diabetes ([place])   . Gait abnormality 09/16/2018  . GERD (gastroesophageal reflux disease)   . Glaucoma   . H/O agent Orange exposure   . History of ARDS 2007  . Hypercholesteremia   . 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 Perirectal abscess    multiple I+D  . Post traumatic stress disorder   . Sepsis  ([place]) 2003  . Sleep apnea     Past Surgical History:  Procedure Laterality Date  . APPENDECTOMY  2003   incidental appendectomy at time of surgery for "ruptured abscess in right lower abdomen" per patient. patient developed ARDS/sepsis.  [name] Kitchen BIOPSY  12/19/2015   Procedure: BIOPSY;  Surgeon: Danie Binder, MD;  [facility];  Service: Endoscopy;;  colon  . 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 COLONOSCOPY WITH PROPOFOL N/A 12/19/2015   Procedure: COLONOSCOPY WITH PROPOFOL;  Surgeon: Danie Binder, MD;  [facility];  Service: Endoscopy;  Laterality: N/A;  1015 - moved to 9:15 - pt knows to arrive at 7:45   . INCISION AND DRAINAGE PERIRECTAL ABSCESS     multiple  . KNEE ARTHROSCOPY Left 2007  . POLYPECTOMY  12/19/2015   Procedure: POLYPECTOMY;  Surgeon: Danie Binder, MD;  [facility];  Service: Endoscopy;;  colon  . REPLACEMENT TOTAL KNEE Left 2007  . SKIN CANCER EXCISION      Family History  Problem Relation Age of Onset  . Colon cancer Mother 58       77, metastatic colon cancer  . Melanoma Sister   . Congestive Heart Failure Sister   . Dementia Father   .  Heart disease Father     Social history:  reports that he quit smoking about 17 years ago. His smoking use included cigarettes. He has a 10.00 pack-year smoking history. His smokeless tobacco use includes chew. He reports current alcohol use. He reports that he does not use drugs.  Medications:  Prior to Admission medications   Medication Sig Start Date End Date Taking? Authorizing Provider  ALPRAZolam Duanne Moron) 1 MG tablet Take 0.5 mg by mouth 2 (two) times daily.    [provider]  amitriptyline (ELAVIL) 10 MG tablet Take 10 mg by mouth 2 (two) times daily.    [provider]  aspirin EC 81 MG tablet Take 81 mg by mouth daily.    [provider]  Cholecalciferol (VITAMIN D3) 1000 units CAPS Take 1 capsule by mouth  daily.    [provider]  DULoxetine (CYMBALTA) 20 MG capsule Take 60 mg by mouth daily.    [provider]  glipiZIDE (GLUCOTROL) 5 MG tablet Take 2.5 mg by mouth daily before breakfast.    [provider]  insulin glargine (LANTUS) 100 UNIT/ML injection Inject 44 Units into the skin daily.    [provider]  Melatonin 3 MG TABS Take 1 tablet by mouth at bedtime.     [provider]  omeprazole (PRILOSEC) 20 MG capsule Take 20 mg by mouth daily.    [provider]  peg 3350 powder (MOVIPREP) 100 g SOLR Take 1 kit (200 g total) by mouth as directed. 12/13/15   Fields, Marga Melnick, MD  QUEtiapine (SEROQUEL) 25 MG tablet Take 25 mg by mouth at bedtime.    [provider]  simvastatin (ZOCOR) 40 MG tablet Take 80 mg by mouth daily.     [provider]  timolol (BETIMOL) 0.5 % ophthalmic solution Place 1 drop into the left eye 2 (two) times daily.    [provider]  UNABLE TO FIND CPAP 16 at night.    [provider]  zolpidem (AMBIEN) 10 MG tablet Take 10 mg by mouth at bedtime.    [provider]     No Known Allergies  ROS:  Out of a complete 14 system review of symptoms, the patient complains only of the following symptoms, and all other reviewed systems are negative.  Fatigue Hearing loss, ringing in the ears, dizziness Skin rash, itching Loss of vision, left eye Snoring Diarrhea Urination problems, impotence Feeling hot Achy muscles Allergies Memory loss Weakness, tremor Depression, anxiety, too much sleep, decreased energy, insomnia  Blood pressure (!) 141/77, pulse 73, temperature 98.4 F (36.9 C), height _0  (1.702 m), weight 196 lb (88.9 kg).  Physical Exam  General: The patient is alert and cooperative at the time of the examination.  Eyes: Pupils are equal, round, and reactive to light. Discs are flat bilaterally.  The left optic disc is pale.  A cataract is noted on the  right.  Neck: The neck is supple, no carotid bruits are noted.  Respiratory: The respiratory examination is clear.  Cardiovascular: The cardiovascular examination reveals a regular rate and rhythm, no obvious murmurs or rubs are noted.  Skin: Extremities are without significant edema.  Neurologic Exam  Mental status: The patient is alert and oriented x 3 at the time of the examination. The patient has apparent normal recent and remote memory, with an apparently normal attention span and concentration ability.  Cranial nerves: Facial symmetry is present. There is good sensation of the face  to pinprick and soft touch bilaterally. The strength of the facial muscles and the muscles to head turning and shoulder shrug are normal bilaterally. Speech is well enunciated, no aphasia or dysarthria is noted. Extraocular movements are full. Visual fields are full, but the patient has minimal vision from the left eye. The tongue is midline, and the patient has symmetric elevation of the soft palate. No obvious hearing deficits are noted.  Motor strength with neck flexion and extension is normal.  Motor: The motor testing reveals 4/5 strength with the deltoids, triceps, biceps, intrinsic muscles of the hands laterally.  With the lower extremities, there is 4/5 strength with hip flexion, the patient has bilateral foot drops.Kermit Balo symmetric motor tone is noted throughout.  Sensory: Sensory testing is intact to pinprick, soft touch, vibration sensation, and position sense on the upper extremities.  With the lower extremities, no definite stocking pattern pinprick sensory deficit was noted.  The patient does have some mild impairment in vibration sensation and position sensation in both feet.  No evidence of extinction is noted.  Coordination: Cerebellar testing reveals good finger-nose-finger and heel-to-shin bilaterally.  Minimal tremor is noted when drawing a spiral  Gait and station: Gait is slightly  wide-based, the patient has difficulty rising from a seated position, has to push off with his hands.  The patient has good arm swing bilaterally, no tremor seen when walking.  He is unable perform tandem gait.  Romberg is negative but is unsteady.  Reflexes: Deep tendon reflexes are somewhat depressed throughout, the patient does have a retained right knee jerk reflex, none on the left, ankle jerk reflexes are absent. Toes are downgoing bilaterally.   Assessment/Plan:  1.  Reports of tremor, bilateral upper extremities  2.  Generalized weakness, quadriparesis  3.  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  It is possible that his tremor may be a "fatigue tremor" associated with more generalized muscle weakness.  There is no evidence of parkinsonism.  The patient will be sent for blood work today, nerve conduction studies will be done on one arm and both legs and EMG will be done on 1 leg.  The patient will be set up for MRI of the cervical spine to exclude a cervical myelopathy.  He will follow-up for the EMG study and follow-up in 4 months.  Jill Alexanders MD 09/16/2018 10:10 AM  [facility] [address] [place], [place] [address]  Phone [phone] Fax [phone]

## 2018-09-19 ENCOUNTER — Telehealth: Payer: Self-pay | Admitting: Neurology

## 2018-09-19 LAB — RPR: RPR Ser Ql: NONREACTIVE

## 2018-09-19 LAB — MULTIPLE MYELOMA PANEL, SERUM
Albumin SerPl Elph-Mcnc: 4 g/dL (ref 2.9–4.4)
Albumin/Glob SerPl: 1.4 (ref 0.7–1.7)
Alpha 1: 0.2 g/dL (ref 0.0–0.4)
Alpha2 Glob SerPl Elph-Mcnc: 0.9 g/dL (ref 0.4–1.0)
B-Globulin SerPl Elph-Mcnc: 0.9 g/dL (ref 0.7–1.3)
Gamma Glob SerPl Elph-Mcnc: 1 g/dL (ref 0.4–1.8)
Globulin, Total: 3 g/dL (ref 2.2–3.9)
IgA/Immunoglobulin A, Serum: 208 mg/dL (ref 61–437)
IgG (Immunoglobin G), Serum: 1039 mg/dL (ref 603–1613)
IgM (Immunoglobulin M), Srm: 87 mg/dL (ref 20–172)
Total Protein: 7 g/dL (ref 6.0–8.5)

## 2018-09-19 LAB — LYME, WESTERN BLOT, SERUM (REFLEXED)
IgG P23 Ab.: ABSENT
IgG P28 Ab.: ABSENT
IgG P30 Ab.: ABSENT
IgG P39 Ab.: ABSENT
IgG P41 Ab.: ABSENT
IgG P45 Ab.: ABSENT
IgG P58 Ab.: ABSENT
IgG P66 Ab.: ABSENT
IgM P23 Ab.: ABSENT
IgM P39 Ab.: ABSENT
IgM P41 Ab.: ABSENT
Lyme IgG Wb: NEGATIVE
Lyme IgM Wb: NEGATIVE

## 2018-09-19 LAB — VITAMIN B12: Vitamin B-12: 1136 pg/mL (ref 232–1245)

## 2018-09-19 LAB — ANGIOTENSIN CONVERTING ENZYME: Angio Convert Enzyme: 22 U/L (ref 14–82)

## 2018-09-19 LAB — CK: Total CK: 258 U/L (ref 41–331)

## 2018-09-19 LAB — ANA W/REFLEX: Anti Nuclear Antibody (ANA): NEGATIVE

## 2018-09-19 LAB — TSH: TSH: 3.15 u[IU]/mL (ref 0.450–4.500)

## 2018-09-19 LAB — SEDIMENTATION RATE: Sed Rate: 2 mm/hr (ref 0–30)

## 2018-09-19 LAB — ACETYLCHOLINE RECEPTOR, BINDING: AChR Binding Ab, Serum: 0.05 nmol/L (ref 0.00–0.24)

## 2018-09-19 LAB — B. BURGDORFI ANTIBODIES: Lyme IgG/IgM Ab: 1.19 {ISR} — ABNORMAL HIGH (ref 0.00–0.90)

## 2018-09-19 LAB — VGCC ANTIBODY: VGCC Antibody: POSITIVE — AB

## 2018-09-19 NOTE — Telephone Encounter (Signed)
I called the patient.  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  The patient will be undergoing EMG nerve conduction study evaluation in the near future, this should help confirm the diagnosis.  MRI of the cervical spine will be done.  If this syndrome is confirmed, he will need a work-up for possible cancer.

## 2018-09-21 NOTE — Telephone Encounter (Signed)
Pt wife is asking for a call from RN to discuss if the date set is it ok to wait that long based on what Dr Jannifer Franklin shared on 08-08.  Please call

## 2018-09-21 NOTE — Telephone Encounter (Signed)
I reached out to the pt's wife and advised the current appt date for the [facility] is the next available. Pt has been added to the wait list and will be contacted if an earlier appointment opens up.

## 2018-09-24 NOTE — Telephone Encounter (Signed)
Pt wife(made aware Dr Jannifer Franklin is out of office today and next week) is asking for a call to discuss results being available on mychart and questions concerning Lambert-Eaton syndrome.  Please call

## 2018-09-28 NOTE — Telephone Encounter (Addendum)
Late entry~ on 09/24/2018 I reached out to the pt's wife to discuss this message. Wife reports she has done research and located if LEMS antibodies came back positive. Pt has a greater chance of being dx with lung cancer. Wife reports several individuals in the pt's family have passed away from lung cancer and wanted to know if a chest x-ray should be completed?  I advised the pt's wife Dr. Jannifer Franklin is currently out of town and will not return until 10/05/2018. Wife was agreeable with waiting for him to return to review.   Best call back # [phone].

## 2018-10-04 NOTE — Telephone Encounter (Signed)
I called the wife.  I discussed the results of the LEMS antibodies.  Nerve conduction studies are usually very easy way to confirm diagnosis, if the diagnosis is confirmed, then we will pursue a cancer work-up.  The patient is scheduled to have EMG in September.

## 2018-10-12 ENCOUNTER — Ambulatory Visit
Admission: RE | Admit: 2018-10-12 | Discharge: 2018-10-12 | Disposition: A | Discharge status: Home / Self Care | Payer: Medicare HMO | Source: Ambulatory Visit | Attending: Neurology | Admitting: Neurology

## 2018-10-12 ENCOUNTER — Other Ambulatory Visit: Payer: Self-pay

## 2018-10-12 ENCOUNTER — Telehealth: Payer: Self-pay | Admitting: Neurology

## 2018-10-12 DIAGNOSIS — M6281 Muscle weakness (generalized): Secondary | ICD-10-CM

## 2018-10-12 NOTE — Telephone Encounter (Signed)
I called the patient, talk with the wife.  MRI of the cervical spine does not show anything that would cause weakness of the arms and legs.  The patient will be coming in for EMG and nerve conduction study evaluation in the near future.   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

## 2018-10-13 ENCOUNTER — Other Ambulatory Visit: Payer: Medicare Other

## 2018-10-13 ENCOUNTER — Telehealth: Payer: Self-pay

## 2018-10-13 NOTE — Telephone Encounter (Signed)
EMG appointment moved to 10/14/2018 at 3 pm.

## 2018-10-14 ENCOUNTER — Ambulatory Visit: Payer: Medicare HMO | Admitting: Neurology

## 2018-10-14 ENCOUNTER — Ambulatory Visit (INDEPENDENT_AMBULATORY_CARE_PROVIDER_SITE_OTHER): Payer: Medicare HMO | Admitting: Neurology

## 2018-10-14 ENCOUNTER — Encounter: Payer: Self-pay | Admitting: Neurology

## 2018-10-14 ENCOUNTER — Other Ambulatory Visit: Payer: Self-pay

## 2018-10-14 DIAGNOSIS — R269 Unspecified abnormalities of gait and mobility: Secondary | ICD-10-CM

## 2018-10-14 DIAGNOSIS — R531 Weakness: Secondary | ICD-10-CM

## 2018-10-14 DIAGNOSIS — M6281 Muscle weakness (generalized): Secondary | ICD-10-CM | POA: Diagnosis not present

## 2018-10-14 NOTE — Progress Notes (Signed)
Please refer to EMG and nerve conduction procedure note.  

## 2018-10-14 NOTE — Procedures (Signed)
HISTORY:  Javier Cook is a 68 year old gentleman with a history of progressive weakness of all 4 extremities.  He does have a history of diabetes, he has noted muscle fasciculations in the muscles of the upper extremities.  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  NERVE CONDUCTION STUDIES:  Nerve conduction studies were performed on the right upper extremity.  The distal motor latencies for the right median and ulnar nerves were normal with a low motor amplitude for the right ulnar nerve and normal for the right median nerve.  The nerve conduction velocities for the median and ulnar nerves were normal.  The sensory latencies for the right median and ulnar nerves were normal.  The F-wave latency for the right ulnar nerve was normal.  Nerve conduction studies were performed on both lower extremities.  The distal motor latencies for the peroneal nerves were prolonged bilaterally with a low motor amplitude for the left peroneal nerve, normal for the right.  The distal motor latencies and motor amplitudes for the posterior tibial nerves were normal bilaterally.  Slowing was seen for the peroneal and posterior tibial nerves bilaterally.  The sensory latencies for the sural nerves were within normal limits bilaterally, the right peroneal sensory latency was prolonged, normal for the left peroneal nerve.  The F-wave latencies for the posterior tibial nerves were slightly prolonged on the right, normal on the left.  EMG STUDIES:  EMG study was performed on the right upper extremity:  The first dorsal interosseous muscle reveals 2 to 5 K units with decreased recruitment.  2+ fibrillations and positive waves were noted. The abductor pollicis brevis muscle reveals 2 to 4 K units with markedly decreased recruitment. No fibrillations or positive waves were noted. The extensor indicis proprius muscle reveals 1 to 3 K units with full  recruitment. No fibrillations or positive waves were noted. The pronator teres muscle reveals 2 to 3 K units with decreased recruitment.  1+ positive waves were noted. The biceps muscle reveals 1 to 4 K units with decreased recruitment.  2+ fibrillations and positive waves were noted.  1+ fasciculations were seen. The triceps muscle reveals 2 to 4 K units with decreased recruitment. No fibrillations or positive waves were noted.  1+ fasciculations were seen. The anterior deltoid muscle reveals 2 to 3 K units with decreased recruitment.  1+ fibrillations and positive waves were noted. The cervical paraspinal muscles were tested at 2 levels. No abnormalities of insertional activity were seen at either level tested. There was good relaxation.  EMG study was performed on the right lower extremity:  The tibialis anterior muscle reveals 2 to 5K motor units with decreased recruitment.  1+ positive waves were seen. The peroneus tertius muscle reveals 2 to 5K motor units with decreased recruitment.  1+ positive waves were seen. The medial gastrocnemius muscle reveals 1 to 4K motor units with decreased recruitment. No fibrillations or positive waves were seen. The vastus lateralis muscle reveals 2 to 6K motor units with decreased recruitment. No fibrillations or positive waves were seen. The iliopsoas muscle reveals 2 to 5K motor units with slightly decreased recruitment. No fibrillations or positive waves were seen. The biceps femoris muscle (long head) reveals 2 to 4K motor units with full recruitment. No fibrillations or positive waves were seen. The lumbosacral paraspinal muscles were tested at 3 levels, and revealed 1+ positive waves at all 3 levels tested. There was good relaxation.   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  EMG evaluation of the right upper and right lower  extremities shows evidence of chronic and acute denervation in distal and proximal muscles, fasciculations are seen in proximal muscles of the upper extremity.  The EMG and nerve conduction study evaluation is not consistent with Lambert-Eaton syndrome.  The possibility of an anterior horn cell disease process such as ALS should be considered.  Jill Alexanders MD 10/14/2018 4:47 PM  [facility] [address] [place], [place] [address]  Phone [phone] Fax [phone]

## 2018-10-14 NOTE — Progress Notes (Signed)
The patient comes in today for EMG nerve conduction study evaluation.  Nerve conduction studies show evidence of a predominantly motor neuropathy, the patient does have a history of diabetes.  EMG of the right arm and right leg however show diffuse acute and chronic neuropathic denervation proximally and distally.  Fasciculations are seen in muscles of the proximal upper extremity.  Once again reflexes appear to be depressed, there is no evidence of spasticity or Babinski signs.  The above evaluation is not consistent with Lambert-Eaton syndrome.  Even though no upper motor neuron signs are present, the possibility of ALS is still entertained.  I will send the patient for a second opinion to Dr. Vallarie Mare at [facility].  We will check further blood work today, check a 24-hour urine study.  Given the fact that the Lems antibodies were positive through [facility], we will recheck through [facility] labs.  The patient does report some problems with choking with eating about 3 times a week, this is unusual for him.  He does believe that his motor strength is progressively worsening.

## 2018-10-15 ENCOUNTER — Telehealth: Payer: Self-pay

## 2018-10-15 NOTE — Telephone Encounter (Signed)
Dr. Jannifer Franklin has ordered blood test VGCC antibody test through [employer]~ the order requires pt signature.  I have contacted the pt and left a vm asking him to call me back so we could discuss how to get the form to him to sign.

## 2018-10-20 ENCOUNTER — Telehealth: Payer: Self-pay

## 2018-10-20 ENCOUNTER — Telehealth: Payer: Self-pay | Admitting: Neurology

## 2018-10-20 DIAGNOSIS — M6281 Muscle weakness (generalized): Secondary | ICD-10-CM | POA: Diagnosis not present

## 2018-10-20 LAB — PAN-ANCA
ANCA Proteinase 3: <3.5 U/mL (ref 0.0–3.5)
Atypical pANCA: <1:20 {titer}
C-ANCA: <1:20 {titer}
Myeloperoxidase Ab: <9 U/mL (ref 0.0–9.0)
P-ANCA: <1:20 {titer}

## 2018-10-20 LAB — PARANEOPLASTIC PROFILE 1
Neuronal Nuc Ab (Ri), IFA: <1:10 {titer}
Neuronal Nuclear (Hu) Antibody (IB): <1:10 {titer}
Purkinje Cell (Yo) Autoantobodies- IFA: <1:10 {titer}

## 2018-10-20 LAB — HEPATITIS C ANTIBODY: Hep C Virus Ab: 0.1 s/co ratio (ref 0.0–0.9)

## 2018-10-20 NOTE — Telephone Encounter (Signed)
Patient's wife like to know where her husband is suppose to go to get labs drawn.

## 2018-10-20 NOTE — Telephone Encounter (Signed)
[employer] medicare Josem KaufmannRO:[id number] (exp. 10/09/18 to 04/07/19) patient had MRI at GI on 10/12/18.

## 2018-10-20 NOTE — Telephone Encounter (Signed)
Patient is scheduled with Referral to Dr. Tedra Coupe 11/10/2018 . Patient and his wife are aware.

## 2018-10-21 NOTE — Telephone Encounter (Signed)
I returned the pt's wife call and I was able to discuss the [facility] diagnostic process. I explained the pt's signature is required for a insurance benefit investigation to be completed and see if insurance will cover blood test and confirm if blood test could be drawn at home or a [facility] would need to be used. Pt's wife verbalized understanding and will come by tomorrow to sign the form.

## 2018-10-21 NOTE — Telephone Encounter (Signed)
Pt returned call and would like RN to call her back when available.

## 2018-10-21 NOTE — Telephone Encounter (Signed)
Dr. Jannifer Franklin has ordered blood tests through [facility]. Chesley Noon will set up the blood draw but the pt has to come in and sign the insurance benefits form first. I had left a vm on 10/15/2018 regarding this and left second vm today.

## 2018-10-22 NOTE — Telephone Encounter (Signed)
Pt came by the office today and signed the [facility] lab work form. I faxed to # [phone] and received confirmation.

## 2018-10-23 LAB — HEAVY METALS PROFILE, URINE
Arsenic Ur: NOT DETECTED ug/L (ref 0–50)
Arsenic(Inorganic),U: NOT DETECTED ug/L (ref 0–19)
Creatinine(Crt),U: 0.2 g/L — ABNORMAL LOW (ref 0.30–3.00)
Lead, Rand Ur: NOT DETECTED ug/L (ref 0–49)
Mercury, Ur: NOT DETECTED ug/L (ref 0–19)

## 2018-11-03 ENCOUNTER — Encounter

## 2018-11-03 ENCOUNTER — Encounter: Payer: No Typology Code available for payment source | Admitting: Neurology

## 2018-11-03 NOTE — Telephone Encounter (Signed)
Received fax from [facility] stating they will be forwarding the appropriate phlebotomy kit to the patient's home and arrange for the blood draw.

## 2018-11-10 DIAGNOSIS — R251 Tremor, unspecified: Secondary | ICD-10-CM | POA: Diagnosis not present

## 2018-11-10 DIAGNOSIS — R531 Weakness: Secondary | ICD-10-CM | POA: Diagnosis not present

## 2018-11-10 DIAGNOSIS — G600 Hereditary motor and sensory neuropathy: Secondary | ICD-10-CM | POA: Diagnosis not present

## 2018-11-10 DIAGNOSIS — M9941 Connective tissue stenosis of neural canal of cervical region: Secondary | ICD-10-CM | POA: Diagnosis not present

## 2018-11-10 DIAGNOSIS — R253 Fasciculation: Secondary | ICD-10-CM | POA: Diagnosis not present

## 2018-11-10 DIAGNOSIS — M50221 Other cervical disc displacement at C4-C5 level: Secondary | ICD-10-CM | POA: Diagnosis not present

## 2018-11-11 DIAGNOSIS — M6281 Muscle weakness (generalized): Secondary | ICD-10-CM | POA: Diagnosis not present

## 2018-11-19 ENCOUNTER — Telehealth: Payer: Self-pay | Admitting: Neurology

## 2018-11-19 NOTE — Telephone Encounter (Signed)
I reached out to the pt's wife. She reports the pt went last week for the [employer] blood test to be completed at [facility]. Pt's wife wanted to know if 1. Had we received the results and 2. Would the results be released through my chart.  I advised once [employer] received results and finalized we would receive a fax then be able to reach out and advise on labs. Pt's wife was agreeable.

## 2018-11-19 NOTE — Telephone Encounter (Signed)
Pt wife is asking for a call with lab results when available

## 2018-11-30 NOTE — Telephone Encounter (Signed)
I reached out to the pt's wife and advised I had not received a fax from [place] yet with the blood test results.  Pt's wife reports she called pt support and spoke with Evette Doffing who advised the report was faxed on 11/27/2018. Pt's wife provided me with two different # [phone] and [id number]. I will give [facility] until tomorrow to receive labs and if not received I will call # provided and inquire about labs. I advised pt's wife I would touch base with her tomorrow.

## 2018-11-30 NOTE — Telephone Encounter (Signed)
I have not received lab results as of yet.

## 2018-11-30 NOTE — Telephone Encounter (Signed)
Pt's wife called wanting to know if the fax with the lab results have been received. She states they were faxed Friday. Please advise.

## 2018-12-01 ENCOUNTER — Telehealth: Payer: Self-pay | Admitting: Neurology

## 2018-12-01 NOTE — Telephone Encounter (Signed)
[facility] blood tests has been received and I have placed in MD's office for Dr. Jannifer Franklin to review.

## 2018-12-01 NOTE — Telephone Encounter (Signed)
The test for anti-VGCC antibody done through [facility] labs was negative.

## 2018-12-25 DIAGNOSIS — G1221 Amyotrophic lateral sclerosis: Secondary | ICD-10-CM | POA: Diagnosis not present

## 2018-12-25 DIAGNOSIS — E1142 Type 2 diabetes mellitus with diabetic polyneuropathy: Secondary | ICD-10-CM | POA: Diagnosis not present

## 2018-12-25 DIAGNOSIS — G4733 Obstructive sleep apnea (adult) (pediatric): Secondary | ICD-10-CM | POA: Diagnosis not present

## 2018-12-25 DIAGNOSIS — K219 Gastro-esophageal reflux disease without esophagitis: Secondary | ICD-10-CM | POA: Diagnosis not present

## 2018-12-25 DIAGNOSIS — L409 Psoriasis, unspecified: Secondary | ICD-10-CM | POA: Diagnosis not present

## 2019-01-13 DIAGNOSIS — R531 Weakness: Secondary | ICD-10-CM | POA: Diagnosis not present

## 2019-01-13 DIAGNOSIS — G1221 Amyotrophic lateral sclerosis: Secondary | ICD-10-CM | POA: Diagnosis not present

## 2019-01-19 ENCOUNTER — Ambulatory Visit: Payer: No Typology Code available for payment source | Admitting: Neurology

## 2019-03-19 DIAGNOSIS — E7800 Pure hypercholesterolemia, unspecified: Secondary | ICD-10-CM | POA: Diagnosis not present

## 2019-03-19 DIAGNOSIS — R69 Illness, unspecified: Secondary | ICD-10-CM | POA: Diagnosis not present

## 2019-03-19 DIAGNOSIS — Z7984 Long term (current) use of oral hypoglycemic drugs: Secondary | ICD-10-CM | POA: Diagnosis not present

## 2019-03-19 DIAGNOSIS — J42 Unspecified chronic bronchitis: Secondary | ICD-10-CM | POA: Diagnosis not present

## 2019-03-19 DIAGNOSIS — E1142 Type 2 diabetes mellitus with diabetic polyneuropathy: Secondary | ICD-10-CM | POA: Diagnosis not present

## 2019-07-14 DIAGNOSIS — G1221 Amyotrophic lateral sclerosis: Secondary | ICD-10-CM | POA: Diagnosis not present

## 2019-07-14 DIAGNOSIS — R29898 Other symptoms and signs involving the musculoskeletal system: Secondary | ICD-10-CM | POA: Diagnosis not present

## 2019-10-13 DIAGNOSIS — Z7409 Other reduced mobility: Secondary | ICD-10-CM | POA: Diagnosis not present

## 2019-10-13 DIAGNOSIS — M21371 Foot drop, right foot: Secondary | ICD-10-CM | POA: Diagnosis not present

## 2019-10-13 DIAGNOSIS — R7309 Other abnormal glucose: Secondary | ICD-10-CM | POA: Diagnosis not present

## 2019-10-13 DIAGNOSIS — G1221 Amyotrophic lateral sclerosis: Secondary | ICD-10-CM | POA: Diagnosis not present

## 2019-10-13 DIAGNOSIS — R531 Weakness: Secondary | ICD-10-CM | POA: Diagnosis not present

## 2019-10-13 DIAGNOSIS — E611 Iron deficiency: Secondary | ICD-10-CM | POA: Diagnosis not present

## 2019-10-13 DIAGNOSIS — M21372 Foot drop, left foot: Secondary | ICD-10-CM | POA: Diagnosis not present

## 2019-10-13 DIAGNOSIS — R2681 Unsteadiness on feet: Secondary | ICD-10-CM | POA: Diagnosis not present

## 2019-11-25 DIAGNOSIS — G4700 Insomnia, unspecified: Secondary | ICD-10-CM | POA: Diagnosis not present

## 2019-11-25 DIAGNOSIS — E7800 Pure hypercholesterolemia, unspecified: Secondary | ICD-10-CM | POA: Diagnosis not present

## 2019-11-25 DIAGNOSIS — E1142 Type 2 diabetes mellitus with diabetic polyneuropathy: Secondary | ICD-10-CM | POA: Diagnosis not present

## 2019-11-25 DIAGNOSIS — R69 Illness, unspecified: Secondary | ICD-10-CM | POA: Diagnosis not present

## 2019-11-25 DIAGNOSIS — J42 Unspecified chronic bronchitis: Secondary | ICD-10-CM | POA: Diagnosis not present

## 2020-01-19 DIAGNOSIS — G1221 Amyotrophic lateral sclerosis: Secondary | ICD-10-CM | POA: Diagnosis not present

## 2020-01-19 DIAGNOSIS — R531 Weakness: Secondary | ICD-10-CM | POA: Diagnosis not present

## 2020-03-08 DIAGNOSIS — G8929 Other chronic pain: Secondary | ICD-10-CM | POA: Diagnosis not present

## 2020-03-08 DIAGNOSIS — E7800 Pure hypercholesterolemia, unspecified: Secondary | ICD-10-CM | POA: Diagnosis not present

## 2020-03-08 DIAGNOSIS — R69 Illness, unspecified: Secondary | ICD-10-CM | POA: Diagnosis not present

## 2020-03-08 DIAGNOSIS — J42 Unspecified chronic bronchitis: Secondary | ICD-10-CM | POA: Diagnosis not present

## 2020-03-08 DIAGNOSIS — E1142 Type 2 diabetes mellitus with diabetic polyneuropathy: Secondary | ICD-10-CM | POA: Diagnosis not present

## 2020-03-08 DIAGNOSIS — K219 Gastro-esophageal reflux disease without esophagitis: Secondary | ICD-10-CM | POA: Diagnosis not present

## 2020-03-08 DIAGNOSIS — G4700 Insomnia, unspecified: Secondary | ICD-10-CM | POA: Diagnosis not present

## 2020-11-27 ENCOUNTER — Encounter: Payer: Self-pay | Admitting: *Deleted

## 2020-12-22 ENCOUNTER — Other Ambulatory Visit: Payer: Self-pay

## 2020-12-22 ENCOUNTER — Inpatient Hospital Stay (HOSPITAL_COMMUNITY)
Admission: EM | Admit: 2020-12-22 | Discharge: 2020-12-26 | DRG: 177 | Disposition: A | Discharge status: Home Health | Payer: No Typology Code available for payment source | Attending: Internal Medicine | Admitting: Internal Medicine

## 2020-12-22 ENCOUNTER — Emergency Department (HOSPITAL_COMMUNITY): Payer: No Typology Code available for payment source

## 2020-12-22 ENCOUNTER — Encounter (HOSPITAL_COMMUNITY): Payer: Self-pay | Admitting: Emergency Medicine

## 2020-12-22 DIAGNOSIS — G1221 Amyotrophic lateral sclerosis: Secondary | ICD-10-CM | POA: Diagnosis present

## 2020-12-22 DIAGNOSIS — I82409 Acute embolism and thrombosis of unspecified deep veins of unspecified lower extremity: Secondary | ICD-10-CM

## 2020-12-22 DIAGNOSIS — J690 Pneumonitis due to inhalation of food and vomit: Secondary | ICD-10-CM | POA: Diagnosis present

## 2020-12-22 DIAGNOSIS — K219 Gastro-esophageal reflux disease without esophagitis: Secondary | ICD-10-CM | POA: Diagnosis present

## 2020-12-22 DIAGNOSIS — R7989 Other specified abnormal findings of blood chemistry: Secondary | ICD-10-CM | POA: Diagnosis not present

## 2020-12-22 DIAGNOSIS — Z20822 Contact with and (suspected) exposure to covid-19: Secondary | ICD-10-CM | POA: Diagnosis present

## 2020-12-22 DIAGNOSIS — R6510 Systemic inflammatory response syndrome (SIRS) of non-infectious origin without acute organ dysfunction: Secondary | ICD-10-CM | POA: Diagnosis not present

## 2020-12-22 DIAGNOSIS — R059 Cough, unspecified: Secondary | ICD-10-CM | POA: Diagnosis not present

## 2020-12-22 DIAGNOSIS — Z8249 Family history of ischemic heart disease and other diseases of the circulatory system: Secondary | ICD-10-CM

## 2020-12-22 DIAGNOSIS — F4310 Post-traumatic stress disorder, unspecified: Secondary | ICD-10-CM | POA: Diagnosis present

## 2020-12-22 DIAGNOSIS — R0902 Hypoxemia: Secondary | ICD-10-CM | POA: Diagnosis not present

## 2020-12-22 DIAGNOSIS — R0602 Shortness of breath: Secondary | ICD-10-CM

## 2020-12-22 DIAGNOSIS — J9601 Acute respiratory failure with hypoxia: Secondary | ICD-10-CM | POA: Diagnosis present

## 2020-12-22 DIAGNOSIS — R1314 Dysphagia, pharyngoesophageal phase: Secondary | ICD-10-CM

## 2020-12-22 DIAGNOSIS — Z794 Long term (current) use of insulin: Secondary | ICD-10-CM | POA: Diagnosis not present

## 2020-12-22 DIAGNOSIS — E119 Type 2 diabetes mellitus without complications: Secondary | ICD-10-CM | POA: Diagnosis present

## 2020-12-22 DIAGNOSIS — H409 Unspecified glaucoma: Secondary | ICD-10-CM | POA: Diagnosis present

## 2020-12-22 DIAGNOSIS — Z85828 Personal history of other malignant neoplasm of skin: Secondary | ICD-10-CM | POA: Diagnosis not present

## 2020-12-22 DIAGNOSIS — E1169 Type 2 diabetes mellitus with other specified complication: Secondary | ICD-10-CM

## 2020-12-22 DIAGNOSIS — Z7984 Long term (current) use of oral hypoglycemic drugs: Secondary | ICD-10-CM | POA: Diagnosis not present

## 2020-12-22 DIAGNOSIS — Z96652 Presence of left artificial knee joint: Secondary | ICD-10-CM | POA: Diagnosis present

## 2020-12-22 DIAGNOSIS — Z7982 Long term (current) use of aspirin: Secondary | ICD-10-CM | POA: Diagnosis not present

## 2020-12-22 DIAGNOSIS — Z79899 Other long term (current) drug therapy: Secondary | ICD-10-CM | POA: Diagnosis not present

## 2020-12-22 DIAGNOSIS — F32A Depression, unspecified: Secondary | ICD-10-CM | POA: Diagnosis present

## 2020-12-22 DIAGNOSIS — Z66 Do not resuscitate: Secondary | ICD-10-CM | POA: Diagnosis present

## 2020-12-22 DIAGNOSIS — Z87891 Personal history of nicotine dependence: Secondary | ICD-10-CM | POA: Diagnosis not present

## 2020-12-22 DIAGNOSIS — R0600 Dyspnea, unspecified: Secondary | ICD-10-CM | POA: Diagnosis not present

## 2020-12-22 DIAGNOSIS — Z7401 Bed confinement status: Secondary | ICD-10-CM | POA: Diagnosis not present

## 2020-12-22 DIAGNOSIS — I10 Essential (primary) hypertension: Secondary | ICD-10-CM | POA: Diagnosis not present

## 2020-12-22 DIAGNOSIS — E7800 Pure hypercholesterolemia, unspecified: Secondary | ICD-10-CM | POA: Diagnosis present

## 2020-12-22 DIAGNOSIS — Z808 Family history of malignant neoplasm of other organs or systems: Secondary | ICD-10-CM

## 2020-12-22 DIAGNOSIS — F418 Other specified anxiety disorders: Secondary | ICD-10-CM | POA: Diagnosis not present

## 2020-12-22 DIAGNOSIS — Z800 Family history of malignant neoplasm of digestive organs: Secondary | ICD-10-CM | POA: Diagnosis not present

## 2020-12-22 DIAGNOSIS — E785 Hyperlipidemia, unspecified: Secondary | ICD-10-CM

## 2020-12-22 DIAGNOSIS — R279 Unspecified lack of coordination: Secondary | ICD-10-CM | POA: Diagnosis not present

## 2020-12-22 DIAGNOSIS — Z743 Need for continuous supervision: Secondary | ICD-10-CM | POA: Diagnosis not present

## 2020-12-22 DIAGNOSIS — R918 Other nonspecific abnormal finding of lung field: Secondary | ICD-10-CM | POA: Diagnosis not present

## 2020-12-22 HISTORY — DX: Amyotrophic lateral sclerosis: G12.21

## 2020-12-22 LAB — PROTIME-INR
INR: 1 (ref 0.8–1.2)
Prothrombin Time: 12.9 seconds (ref 11.4–15.2)

## 2020-12-22 LAB — COMPREHENSIVE METABOLIC PANEL
ALT: 20 U/L (ref 0–44)
AST: 22 U/L (ref 15–41)
Albumin: 4 g/dL (ref 3.5–5.0)
Alkaline Phosphatase: 69 U/L (ref 38–126)
Anion gap: 9 (ref 5–15)
BUN: 9 mg/dL (ref 8–23)
CO2: 30 mmol/L (ref 22–32)
Calcium: 8.9 mg/dL (ref 8.9–10.3)
Chloride: 99 mmol/L (ref 98–111)
Creatinine, Ser: 0.55 mg/dL — ABNORMAL LOW (ref 0.61–1.24)
GFR, Estimated: >60 mL/min (ref >60)
Glucose, Bld: 152 mg/dL — ABNORMAL HIGH (ref 70–99)
Potassium: 4.2 mmol/L (ref 3.5–5.1)
Sodium: 138 mmol/L (ref 135–145)
Total Bilirubin: 1.1 mg/dL (ref 0.3–1.2)
Total Protein: 7.3 g/dL (ref 6.5–8.1)

## 2020-12-22 LAB — CBC WITH DIFFERENTIAL/PLATELET
Abs Immature Granulocytes: 0.06 10*3/uL (ref 0.00–0.07)
Basophils Absolute: 0 10*3/uL (ref 0.0–0.1)
Basophils Relative: 0 %
Eosinophils Absolute: 0 10*3/uL (ref 0.0–0.5)
Eosinophils Relative: 0 %
HCT: 46.7 % (ref 39.0–52.0)
Hemoglobin: 15.5 g/dL (ref 13.0–17.0)
Immature Granulocytes: 0 %
Lymphocytes Relative: 4 %
Lymphs Abs: 0.5 10*3/uL — ABNORMAL LOW (ref 0.7–4.0)
MCH: 29.2 pg (ref 26.0–34.0)
MCHC: 33.2 g/dL (ref 30.0–36.0)
MCV: 88.1 fL (ref 80.0–100.0)
Monocytes Absolute: 0.8 10*3/uL (ref 0.1–1.0)
Monocytes Relative: 6 %
Neutro Abs: 12.7 10*3/uL — ABNORMAL HIGH (ref 1.7–7.7)
Neutrophils Relative %: 90 %
Platelets: 203 10*3/uL (ref 150–400)
RBC: 5.3 MIL/uL (ref 4.22–5.81)
RDW: 14 % (ref 11.5–15.5)
WBC: 14.1 10*3/uL — ABNORMAL HIGH (ref 4.0–10.5)
nRBC: 0 % (ref 0.0–0.2)

## 2020-12-22 LAB — RESP PANEL BY RT-PCR (FLU A&B, COVID) ARPGX2
Influenza A by PCR: NEGATIVE
Influenza B by PCR: NEGATIVE
SARS Coronavirus 2 by RT PCR: NEGATIVE

## 2020-12-22 LAB — BLOOD GAS, ARTERIAL
Acid-Base Excess: 3.8 mmol/L — ABNORMAL HIGH (ref 0.0–2.0)
Bicarbonate: 27.1 mmol/L (ref 20.0–28.0)
Drawn by: 27733
FIO2: 36
O2 Saturation: 94.1 %
Patient temperature: 36.9
pCO2 arterial: 47 mmHg (ref 32.0–48.0)
pH, Arterial: 7.397 (ref 7.350–7.450)
pO2, Arterial: 78.4 mmHg — ABNORMAL LOW (ref 83.0–108.0)

## 2020-12-22 LAB — URINALYSIS, ROUTINE W REFLEX MICROSCOPIC
Bilirubin Urine: NEGATIVE
Glucose, UA: NEGATIVE mg/dL
Hgb urine dipstick: NEGATIVE
Ketones, ur: 5 mg/dL — AB
Leukocytes,Ua: NEGATIVE
Nitrite: NEGATIVE
Protein, ur: NEGATIVE mg/dL
Specific Gravity, Urine: 1.012 (ref 1.005–1.030)
pH: 5 (ref 5.0–8.0)

## 2020-12-22 LAB — HEMOGLOBIN A1C
Hgb A1c MFr Bld: 5.3 % (ref 4.8–5.6)
Mean Plasma Glucose: 105.41 mg/dL

## 2020-12-22 LAB — BRAIN NATRIURETIC PEPTIDE: B Natriuretic Peptide: 63 pg/mL (ref 0.0–100.0)

## 2020-12-22 LAB — APTT: aPTT: 28 seconds (ref 24–36)

## 2020-12-22 LAB — CBG MONITORING, ED: Glucose-Capillary: 123 mg/dL — ABNORMAL HIGH (ref 70–99)

## 2020-12-22 LAB — LACTIC ACID, PLASMA: Lactic Acid, Venous: 1.2 mmol/L (ref 0.5–1.9)

## 2020-12-22 LAB — GLUCOSE, CAPILLARY: Glucose-Capillary: 192 mg/dL — ABNORMAL HIGH (ref 70–99)

## 2020-12-22 LAB — D-DIMER, QUANTITATIVE: D-Dimer, Quant: 0.58 ug/mL-FEU — ABNORMAL HIGH (ref 0.00–0.50)

## 2020-12-22 MED ORDER — SODIUM CHLORIDE 0.9 % IV SOLN
3.0000 g | Freq: Four times a day (QID) | INTRAVENOUS | Status: DC
  Administered 2020-12-22 – 2020-12-24 (×7): 3 g via INTRAVENOUS
  Filled 2020-12-22 (×14): qty 8
  Filled 2020-12-22 (×2): qty 3

## 2020-12-22 MED ORDER — VANCOMYCIN HCL 1750 MG/350ML IV SOLN: 1750.0000 mg | Freq: Once | INTRAVENOUS | Status: DC

## 2020-12-22 MED ORDER — METRONIDAZOLE 500 MG/100ML IV SOLN
500.0000 mg | Freq: Once | INTRAVENOUS | Status: AC
  Administered 2020-12-22: 500 mg via INTRAVENOUS
  Filled 2020-12-22: qty 100

## 2020-12-22 MED ORDER — ONDANSETRON HCL 4 MG/2ML IJ SOLN: 4.0000 mg | Freq: Four times a day (QID) | INTRAMUSCULAR | Status: DC | PRN

## 2020-12-22 MED ORDER — POLYVINYL ALCOHOL-POVIDONE PF 1.4-0.6 % OP SOLN: 1.0000 [drp] | Freq: Two times a day (BID) | OPHTHALMIC | Status: DC

## 2020-12-22 MED ORDER — SIMVASTATIN 20 MG PO TABS
20.0000 mg | ORAL_TABLET | Freq: Every day | ORAL | Status: DC
  Filled 2020-12-22: qty 1

## 2020-12-22 MED ORDER — INSULIN DETEMIR 100 UNIT/ML ~~LOC~~ SOLN
20.0000 [IU] | Freq: Every day | SUBCUTANEOUS | Status: DC
  Administered 2020-12-22 – 2020-12-25 (×4): 20 [IU] via SUBCUTANEOUS
  Filled 2020-12-22 (×5): qty 0.2

## 2020-12-22 MED ORDER — ONDANSETRON HCL 4 MG PO TABS: 4.0000 mg | ORAL_TABLET | Freq: Four times a day (QID) | ORAL | Status: DC | PRN

## 2020-12-22 MED ORDER — TIMOLOL HEMIHYDRATE 0.5 % OP SOLN: 1.0000 [drp] | Freq: Two times a day (BID) | OPHTHALMIC | Status: DC

## 2020-12-22 MED ORDER — SODIUM CHLORIDE 0.9 % IV BOLUS (SEPSIS)
1000.0000 mL | Freq: Once | INTRAVENOUS | Status: AC
  Administered 2020-12-22: 1000 mL via INTRAVENOUS

## 2020-12-22 MED ORDER — VANCOMYCIN HCL 1000 MG/200ML IV SOLN: 1000.0000 mg | Freq: Two times a day (BID) | INTRAVENOUS | Status: DC

## 2020-12-22 MED ORDER — FLUTICASONE PROPIONATE 50 MCG/ACT NA SUSP
1.0000 | Freq: Every day | NASAL | Status: DC
  Administered 2020-12-22 – 2020-12-26 (×5): 1 via NASAL
  Filled 2020-12-22: qty 16

## 2020-12-22 MED ORDER — QUETIAPINE FUMARATE 25 MG PO TABS
25.0000 mg | ORAL_TABLET | Freq: Every day | ORAL | Status: DC
  Administered 2020-12-22 – 2020-12-25 (×4): 25 mg via ORAL
  Filled 2020-12-22 (×5): qty 1

## 2020-12-22 MED ORDER — AMITRIPTYLINE HCL 10 MG PO TABS
10.0000 mg | ORAL_TABLET | Freq: Two times a day (BID) | ORAL | Status: DC
  Administered 2020-12-22 – 2020-12-26 (×8): 10 mg via ORAL
  Filled 2020-12-22 (×10): qty 1

## 2020-12-22 MED ORDER — DM-GUAIFENESIN ER 30-600 MG PO TB12
1.0000 | ORAL_TABLET | Freq: Two times a day (BID) | ORAL | Status: DC
Start: 2020-12-22 — End: 2020-12-26
  Administered 2020-12-22 – 2020-12-26 (×8): 1 via ORAL
  Filled 2020-12-22 (×8): qty 1

## 2020-12-22 MED ORDER — CEFEPIME HCL 2 G IJ SOLR
2.0000 g | Freq: Three times a day (TID) | INTRAMUSCULAR | Status: DC
Start: 2020-12-22 — End: 2020-12-22

## 2020-12-22 MED ORDER — IPRATROPIUM-ALBUTEROL 0.5-2.5 (3) MG/3ML IN SOLN
3.0000 mL | Freq: Four times a day (QID) | RESPIRATORY_TRACT | Status: DC
  Administered 2020-12-22 – 2020-12-25 (×11): 3 mL via RESPIRATORY_TRACT
  Filled 2020-12-22 (×11): qty 3

## 2020-12-22 MED ORDER — DULOXETINE HCL 60 MG PO CPEP
60.0000 mg | ORAL_CAPSULE | Freq: Every day | ORAL | Status: DC
  Administered 2020-12-22 – 2020-12-26 (×5): 60 mg via ORAL
  Filled 2020-12-22 (×4): qty 1
  Filled 2020-12-22: qty 2

## 2020-12-22 MED ORDER — ACETAMINOPHEN 325 MG PO TABS: 650.0000 mg | ORAL_TABLET | Freq: Four times a day (QID) | ORAL | Status: DC | PRN

## 2020-12-22 MED ORDER — METHYLPREDNISOLONE SODIUM SUCC 40 MG IJ SOLR
40.0000 mg | Freq: Two times a day (BID) | INTRAMUSCULAR | Status: DC
  Administered 2020-12-22 – 2020-12-25 (×7): 40 mg via INTRAVENOUS
  Filled 2020-12-22 (×7): qty 1

## 2020-12-22 MED ORDER — TIMOLOL MALEATE 0.5 % OP SOLN
1.0000 [drp] | Freq: Two times a day (BID) | OPHTHALMIC | Status: DC
  Administered 2020-12-23 – 2020-12-26 (×7): 1 [drp] via OPHTHALMIC

## 2020-12-22 MED ORDER — RILUZOLE 50 MG PO TABS
50.0000 mg | ORAL_TABLET | Freq: Two times a day (BID) | ORAL | Status: DC
  Administered 2020-12-23 – 2020-12-26 (×6): 50 mg via ORAL

## 2020-12-22 MED ORDER — ENOXAPARIN SODIUM 40 MG/0.4ML IJ SOSY
40.0000 mg | PREFILLED_SYRINGE | INTRAMUSCULAR | Status: DC
  Administered 2020-12-22 – 2020-12-25 (×4): 40 mg via SUBCUTANEOUS
  Filled 2020-12-22 (×5): qty 0.4

## 2020-12-22 MED ORDER — ASPIRIN EC 81 MG PO TBEC
81.0000 mg | DELAYED_RELEASE_TABLET | Freq: Every day | ORAL | Status: DC
  Administered 2020-12-22 – 2020-12-26 (×5): 81 mg via ORAL
  Filled 2020-12-22 (×5): qty 1

## 2020-12-22 MED ORDER — FENTANYL CITRATE PF 50 MCG/ML IJ SOSY: 50.0000 ug | PREFILLED_SYRINGE | INTRAMUSCULAR | Status: DC | PRN

## 2020-12-22 MED ORDER — POLYVINYL ALCOHOL 1.4 % OP SOLN
1.0000 [drp] | Freq: Two times a day (BID) | OPHTHALMIC | Status: DC
  Administered 2020-12-22 – 2020-12-26 (×8): 1 [drp] via OPHTHALMIC
  Filled 2020-12-22: qty 15

## 2020-12-22 MED ORDER — VANCOMYCIN HCL 1500 MG/300ML IV SOLN
1500.0000 mg | Freq: Once | INTRAVENOUS | Status: AC
  Administered 2020-12-22: 1500 mg via INTRAVENOUS
  Filled 2020-12-22: qty 300

## 2020-12-22 MED ORDER — INSULIN ASPART 100 UNIT/ML IJ SOLN
0.0000 [IU] | Freq: Three times a day (TID) | INTRAMUSCULAR | Status: DC
  Administered 2020-12-22: 2 [IU] via SUBCUTANEOUS
  Administered 2020-12-23: 5 [IU] via SUBCUTANEOUS
  Administered 2020-12-23 (×2): 3 [IU] via SUBCUTANEOUS
  Administered 2020-12-24: 2 [IU] via SUBCUTANEOUS
  Administered 2020-12-24: 3 [IU] via SUBCUTANEOUS
  Administered 2020-12-24: 5 [IU] via SUBCUTANEOUS
  Administered 2020-12-25: 3 [IU] via SUBCUTANEOUS
  Administered 2020-12-25: 2 [IU] via SUBCUTANEOUS
  Administered 2020-12-25: 5 [IU] via SUBCUTANEOUS
  Administered 2020-12-26 (×2): 3 [IU] via SUBCUTANEOUS
  Filled 2020-12-22: qty 1

## 2020-12-22 MED ORDER — PANTOPRAZOLE SODIUM 40 MG PO TBEC
40.0000 mg | DELAYED_RELEASE_TABLET | Freq: Every day | ORAL | Status: DC
  Administered 2020-12-22 – 2020-12-26 (×5): 40 mg via ORAL
  Filled 2020-12-22 (×5): qty 1

## 2020-12-22 MED ORDER — ALPRAZOLAM 0.5 MG PO TABS
0.5000 mg | ORAL_TABLET | Freq: Three times a day (TID) | ORAL | Status: DC | PRN
  Administered 2020-12-22 – 2020-12-26 (×7): 0.5 mg via ORAL
  Filled 2020-12-22 (×7): qty 1

## 2020-12-22 MED ORDER — VANCOMYCIN HCL 1000 MG/200ML IV SOLN: 1000.0000 mg | Freq: Once | INTRAVENOUS | Status: DC

## 2020-12-22 MED ORDER — INSULIN ASPART 100 UNIT/ML IJ SOLN
0.0000 [IU] | Freq: Every day | INTRAMUSCULAR | Status: DC
Start: 2020-12-22 — End: 2020-12-26
  Administered 2020-12-23 – 2020-12-25 (×3): 2 [IU] via SUBCUTANEOUS

## 2020-12-22 MED ORDER — LACTATED RINGERS IV SOLN: INTRAVENOUS | Status: AC

## 2020-12-22 MED ORDER — SODIUM CHLORIDE 0.9 % IV BOLUS (SEPSIS)
250.0000 mL | Freq: Once | INTRAVENOUS | Status: AC
  Administered 2020-12-22: 250 mL via INTRAVENOUS

## 2020-12-22 MED ORDER — ACETAMINOPHEN 650 MG RE SUPP: 650.0000 mg | Freq: Four times a day (QID) | RECTAL | Status: DC | PRN

## 2020-12-22 MED ORDER — SODIUM CHLORIDE 0.9 % IV SOLN
2.0000 g | Freq: Once | INTRAVENOUS | Status: AC
  Administered 2020-12-22: 2 g via INTRAVENOUS
  Filled 2020-12-22: qty 2

## 2020-12-22 MED ORDER — LISINOPRIL 10 MG PO TABS
20.0000 mg | ORAL_TABLET | Freq: Every day | ORAL | Status: DC
  Administered 2020-12-22 – 2020-12-26 (×5): 20 mg via ORAL
  Filled 2020-12-22 (×5): qty 2

## 2020-12-22 NOTE — ED Notes (Signed)
Pt wife came to desk and asked to speak with charge nurse. Pt wife states his breathing is "not right". Reassured wife and pt, pt O2 sat 94-95% at this time, no labored breathing noted. Wife rude with staff and demanding pt be transferred to another facility. Informed message has been sent to attending physician and attending physician would be down to assess pt and determine need for transport as soon as he could.

## 2020-12-22 NOTE — ED Provider Notes (Signed)
[facility] EMERGENCY DEPARTMENT Provider Note   CSN: [id number] Arrival date & time: 12/22/20  0920     History Chief Complaint  Patient presents with   Shortness of Breath      Javier Cook is a 70 yo male with a PMH of ALS and diabetes presenting to the emergency room with SOB since Thursday morning, 11/10. Wife states that the patient got his flu shot on Wednesday and started experiencing increased respiratory distress and coughing episodes by Thursday. The patient feels as if he cannot clear his chest congestion or take deep breaths. They tried to use his cough assist machine and CPAP yesterday afternoon with no relief. This morning the wife reports the patient was having even more breathing difficulty so she called EMS. Patient was found to have a spO2 in the 80s on room air when EMS arrived. He was placed on 5L O2 to achieve spO2 of 95%. Wife states patient's respiratory status has been declining over the past couple of weeks with decreased inspiratory effort and point pulmonary toilet.  She has also noticed changes in his voice. Patient denies any documented fevers, chills, nausea, vomiting, abdominal pain, chest pain, or dysuria.  At baseline the patient is occasionally able to ambulate a bit with his walker but is mostly bedbound.  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 patient developed ARDS/sepsis.   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;blind left eye   COLONOSCOPY WITH PROPOFOL N/A 12/19/2015   Procedure: COLONOSCOPY WITH PROPOFOL;  Surgeon: Danie Binder, MD;  Location: [facility];  Service: Endoscopy;  Laterality: N/A;  1015 - moved to 9:15 - pt knows to arrive at [place]     multiple   KNEE ARTHROSCOPY Left 2007   POLYPECTOMY  12/19/2015   Procedure: POLYPECTOMY;  Surgeon: Danie Binder, MD;  Location: [facility];  Service: Endoscopy;;  colon   REPLACEMENT TOTAL KNEE Left 2007   SKIN CANCER  EXCISION         Family History  Problem Relation Age of Onset   Colon cancer Mother 33       77, metastatic colon cancer   Melanoma Sister    Congestive Heart Failure Sister    Dementia Father    Heart disease Father     Social History   Tobacco Use   Smoking status: Former    Packs/day: 1.00    Years: 10.00    Pack years: 10.00    Types: Cigarettes    Quit date: 2003    Years since quitting: 19.8   Smokeless tobacco: Current    Types: Chew   Tobacco comments:    1 can week  Substance Use Topics   Alcohol use: Yes    Comment: rarely   Drug use: Never    Home Medications Prior to Admission medications   Medication Sig Start Date  End Date Taking? Authorizing Provider  ALPRAZolam Duanne Moron) 1 MG tablet Take 0.5 mg by mouth 2 (two) times daily.    [provider]  amitriptyline (ELAVIL) 10 MG tablet Take 10 mg by mouth 2 (two) times daily.    [provider]  aspirin EC 81 MG tablet Take 81 mg by mouth daily.    [provider]  Cholecalciferol (VITAMIN D3) 1000 units CAPS Take 1 capsule by mouth daily.    [provider]  DULoxetine (CYMBALTA) 20 MG capsule Take 60 mg by mouth daily.    [provider]  fluocinonide ointment (LIDEX) 0.05 % Apply topically.    [provider]  glipiZIDE (GLUCOTROL) 5 MG tablet Take 2.5 mg by mouth daily before breakfast.    [provider]  hydrocortisone 2.5 % cream Apply topically.    [provider]  insulin glargine (LANTUS) 100 UNIT/ML injection Inject 46 Units into the skin daily.     [provider]  loratadine (CLARITIN) 10 MG tablet Take 10 mg by mouth daily as needed for allergies.    [provider]  Melatonin 3 MG TABS Take 1 tablet by mouth at bedtime.     [provider]  metFORMIN (GLUCOPHAGE) 850 MG tablet Take 850 mg by mouth 2 (two) times daily with a meal.    [provider]  omeprazole (PRILOSEC) 20 MG capsule Take 20 mg by mouth daily.    [provider]  Polyvinyl Alcohol-Povidone (REFRESH OP) Apply 1 drop to eye 3 (three) times daily.    [provider]  QUEtiapine (SEROQUEL) 25 MG tablet Take 25 mg by mouth at bedtime.    [provider]  simvastatin (ZOCOR) 80 MG tablet Take 40 mg by mouth daily.    [provider]  timolol (BETIMOL) 0.5 % ophthalmic solution Place 1 drop into the left eye 2 (two) times daily.    [provider]  UNABLE TO FIND CPAP 16 at night.    [provider]  UNABLE TO FIND Place 1 spray into the nose 2 (two) times daily. Med Name: Fluticasone nasal spray    [provider]   zolpidem (AMBIEN) 10 MG tablet Take 5 mg by mouth at bedtime.     [provider]    Allergies    Patient has no known allergies.  Review of Systems   Review of Systems  Constitutional:  Positive for activity change, appetite change, chills, fatigue and fever.  HENT:  Positive for congestion.   Eyes: Negative.   Respiratory:  Positive for cough and shortness of breath.   Cardiovascular: Negative.   Gastrointestinal: Negative.   Musculoskeletal:  Positive for myalgias.  Skin: Negative.   Neurological:  Positive for speech difficulty and weakness.  Psychiatric/Behavioral: Negative.     Physical Exam Updated Vital Signs BP (!) 165/94   Pulse (!) 116   Temp 98.4 F (36.9 C) (Oral)   Resp (!) 30   Ht 5\' 7"  (1.702 m)   Wt 73.9 kg   SpO2 94%   BMI 25.53 kg/m   Physical Exam Vitals and nursing note reviewed.  Constitutional:      General: He is not in acute distress.    Appearance: He is well-developed. He is not diaphoretic.  HENT:     Head: Normocephalic and atraumatic.  Eyes:     General: No scleral icterus.    Conjunctiva/sclera: Conjunctivae normal.  Cardiovascular:     Rate and Rhythm: Normal rate and regular rhythm.     Heart sounds: Normal heart sounds.  Pulmonary:     Effort: Pulmonary effort is normal. Tachypnea present. No respiratory distress.     Breath sounds: Decreased air movement present. Examination of the right-lower field reveals rhonchi. Examination of the left-lower field reveals rhonchi. Rhonchi present.     Comments: Patient with poor inspiratory effort and weakness.  He is tachypneic. Abdominal:     Palpations: Abdomen is soft.     Tenderness: There is no abdominal tenderness.  Musculoskeletal:     Cervical back: Normal range of motion and neck supple.  Skin:    General: Skin is warm and dry.  Neurological:     Mental Status: He is alert.  Psychiatric:        Behavior: Behavior normal.    ED Results / Procedures / Treatments    Labs (all labs ordered are listed, but only abnormal results are displayed) Labs Reviewed  CBC WITH DIFFERENTIAL/PLATELET - Abnormal; Notable for the following components:      Result Value   WBC 14.1 (*)    Neutro Abs 12.7 (*)    Lymphs Abs 0.5 (*)    All other components within normal limits  CULTURE, BLOOD (ROUTINE X 2)  CULTURE, BLOOD (ROUTINE X 2)  RESP PANEL BY RT-PCR (FLU A&B, COVID) ARPGX2  LACTIC ACID, PLASMA  LACTIC ACID, PLASMA  COMPREHENSIVE METABOLIC PANEL  PROTIME-INR  APTT  URINALYSIS, ROUTINE W REFLEX MICROSCOPIC  BRAIN NATRIURETIC PEPTIDE  BLOOD GAS, ARTERIAL    EKG None  Radiology DG Chest Port 1 View  Result Date: 12/22/2020 CLINICAL DATA:  Cough with difficulty breathing since recent flu vaccination. Questionable sepsis. EXAM: PORTABLE CHEST 1 VIEW COMPARISON:  Radiographs 03/21/2009 and 11/28/2004. FINDINGS: 1016 hours. Lower lung volumes with probable resulting mild atelectasis at both lung bases. No confluent airspace opacity, pleural effusion or pneumothorax identified. The heart size and mediastinal contours are stable for technique. The bones appear unchanged. Telemetry leads overlie the chest. IMPRESSION: Probable mild bibasilar atelectasis associated with lower lung volumes. No evidence of pneumonia. Electronically Signed   By: Richardean Sale M.D.   On: 12/22/2020 10:25    Procedures .Critical Care Performed by: Margarita Mail, PA-C Authorized by: Margarita Mail, PA-C   Critical care provider statement:    Critical care time (minutes):  60   Critical care time was exclusive of:  Separately billable procedures and treating other patients   Critical care was necessary to treat or prevent imminent or life-threatening deterioration of the following conditions:  Respiratory failure  and sepsis   Critical care was time spent personally by me on the following activities:  Development of treatment plan with patient or surrogate, discussions with  consultants, evaluation of patient's response to treatment, examination of patient, ordering and review of laboratory studies, ordering and review of radiographic studies, ordering and performing treatments and interventions, pulse oximetry, re-evaluation of patient's condition, review of old charts, interpretation of cardiac output measurements and obtaining history from patient or surrogate   Care discussed with: admitting provider     Medications Ordered in ED Medications  lactated ringers infusion (has no administration in time range)  ceFEPIme (MAXIPIME) 2 g in sodium chloride 0.9 % 100 mL IVPB (has no administration in time range)  metroNIDAZOLE (FLAGYL) IVPB 500 mg (has no administration in time range)  vancomycin (VANCOREADY) IVPB 1000 mg/200 mL (has no administration in time range)  sodium chloride 0.9 % bolus 1,000 mL (has no administration in time range)    And  sodium chloride 0.9 % bolus 1,000 mL (has no administration in time range)    And  sodium chloride 0.9 % bolus 250 mL (has no administration in time range)    ED Course  I have reviewed the triage vital signs and the nursing notes.  Pertinent labs & imaging results that were available during my care of the patient were reviewed by me and considered in my medical decision making (see chart for details).  Clinical Course as of 12/22/20 1030  Fri Dec 22, 2020  1028 Patient offered BIPAP to assist with ventilations at this time. He declines unless breathing worsens. Patient also states that his CODE STATUS is DNR/DNI [AH]    Clinical Course User Index [AH] Margarita Mail, PA-C   MDM Rules/Calculators/A&P                           CC:SOB VS:  Vitals:   12/22/20 1215 12/22/20 1230 12/22/20 1245 12/22/20 1300  BP:  (!) 166/85  (!) 175/88  Pulse: (!) 103 (!) 108 (!) 103 (!) 102  Resp: (!) 23   20  Temp:      TempSrc:      SpO2: 92% 92% 92% 92%  Weight:      Height:        CZ:[id number] is gathered by patient and  wife. Previous records obtained and reviewed. DDX:The patient's complaint of sob involves an extensive number of diagnostic and treatment options, and is a complaint that carries with it a high risk of complications, morbidity, and potential mortality. Given the large differential diagnosis, medical decision making is of high complexity. The emergent differential diagnosis for shortness of breath includes, but is not limited to, Pulmonary edema, bronchoconstriction, Pneumonia, Pulmonary embolism, Pneumotherax/ Hemothorax, Dysrythmia, ACS.   Labs: I ordered reviewed and interpreted labs which include CBC with elevated white blood cell count 14,000, CMP with mildly elevated blood glucose of insignificant value, UA negative, ABG shows pure hypoxia.  Respiratory panel is negative for COVID or influenza, PT/INR, APTT and BNP within normal limits. Imaging: I ordered and reviewed images which included chest x-ray. I independently visualized and interpreted all imaging. Significant findings include bibasilar atelectasis. EKG: Sinus tachycardia at a rate of 110 Consults:Dr. Dyann Kief TRH, Dr. Concha Pyo (sp?) [facility] [id number] here with hypoxic respiratory, borderline fever, elevated white blood cell count.  Initially patient treated with broad-spectrum antibiotics and meeting sepsis criteria with both respiratory source, elevated white blood cell count, elevated respiratory  rate and tachycardia.  Patient's initial lactic acid has resulted.  After discussion with Dr. Dyann Kief we have discontinued the code sepsis.  Will be admitted by Dr. Dyann Kief for acute hypoxic respiratory failure.  This is likely multifactorial in the setting of URI-like symptoms versus vaccine side effects and worsening weakness secondary to his ALS.  I have also discussed with the Dr. Concha Pyo of neurology at [facility] who is alerting his ALS coordinator and his physician Dr. Tillman Abide.  She does not feel that he needs to be admitted at this  time to their service. Patient disposition:The patient appears reasonably stabilized for admission considering the current resources, flow, and capabilities available in the ED at this time, and I doubt any other [facility] requiring further screening and/or treatment in the ED prior to admission.       Final Clinical Impression(s) / ED Diagnoses Final diagnoses:  None    Rx / DC Orders ED Discharge Orders     None        Margarita Mail, PA-C 12/22/20 1349    Davonna Belling, MD 12/23/20 1024

## 2020-12-22 NOTE — ED Notes (Signed)
Pt placed on bedpan

## 2020-12-22 NOTE — ED Notes (Signed)
DNR band placed on Pts left wrist.

## 2020-12-22 NOTE — ED Triage Notes (Signed)
Pt reports in the past he has gotten sick after having vaccinations. Pt states that he received his flu vaccine Wednesday afternoon and the next morning patient reports having a cough and difficulty breathing. EMS called out this morning because patient was having difficulty breathing. Pt does not wear oxygen at home but does wear CPAP at night. ON arrival EMS stated on CPAP the patient's oxygen was 76%. On room air patient's o2 sat is 88% in room. Pt is currently on [place] at 4L saturation is 94% with fluctuation. Pt states he is not short of breath right now but feels cold. Wife denies fever 98.4 right now orally. Pt has ALS and is diabetic

## 2020-12-22 NOTE — H&P (Signed)
History and Physical    Javier Cook:[id number] DOB: 11/27/1950 DOA: 12/22/2020  PCP: Lavone Orn, MD  Patient coming from: Home  I have personally briefly reviewed patient's old medical records in [place]  Chief Complaint: Shortness of breath  HPI: Javier Cook is a 70 y.o. 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  Patient and wife reported that he was vaccinated against flu on Wednesday (12/20/2020), and onset Thursday started developing URI symptoms that had rapidly progressed to the point of no receiving relief by the use of his trilogy machine at home.  Due to his underlying history of ALS patient not really able to properly expectorate and ended developing increased work of breathing.  EMS was contacted and on their arrival patient was found to be hypoxic into the mid 70s on room air; he was placed on nonrebreather with improvement in his saturation and then able to been titrated down to 4 L nasal cannula with O2 sats in the mid 90s.   No vaccination records again copy appreciated on his chart; COVID PCR and influenza negative while in the ED.  ED Course: Patient was tachypneic, tachycardic and hypoxic requiring oxygen supplementation.  Increased work of breathing and difficulty speaking in full sentences appreciated on exam.  Chest x-ray has failed to demonstrate any infiltrates or active cardiopulmonary process.  Given patient history and risk for decompensation while presenting SIRS Criteria, TRH has been contacted to place in the hospital for further evaluation and management.  Cultures taken and empiric antibiotic started with presumption of aspiration pneumonia.    D-dimer was mildly  elevated and the patient reports no chest pain.  Lower extremity Dopplers has been ordered.  Review of Systems: As per HPI otherwise; patient denies nausea, vomiting, abdominal pain, dysuria, hematuria, chest pain, new focal weakness/deficits, overt bleeding or any other complaints.  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 patient developed ARDS/sepsis.   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 His smoking use included  cigarettes. He has a 10.00 pack-year smoking history. His smokeless tobacco use includes chew. He reports current alcohol use. He reports that he does not use drugs.  No Known Allergies  Family History  Problem Relation Age of Onset   Colon cancer Mother 75       77, metastatic colon cancer   Melanoma Sister    Congestive Heart Failure Sister    Dementia Father    Heart disease Father     Prior to Admission medications   Medication Sig Start Date End Date Taking? Authorizing Provider  ALPRAZolam Duanne Moron) 1 MG tablet Take 0.5 mg by mouth 2 (two) times daily.   Yes [provider]  amitriptyline (ELAVIL) 10 MG tablet Take 10 mg by mouth 2 (two) times daily.   Yes [provider]  aspirin EC 81 MG tablet Take 81 mg by mouth daily.   Yes [provider]  Cholecalciferol (VITAMIN D3) 1000 units CAPS Take 1 capsule by mouth daily.   Yes [provider]  DULoxetine (CYMBALTA) 20 MG capsule Take 60 mg by mouth daily.   Yes [provider]  fluocinonide ointment (LIDEX) 2.68 % Apply 1 application topically daily.   Yes [provider]  insulin glargine (LANTUS) 100 UNIT/ML injection Inject 30 Units into the skin daily.   Yes [provider]  lisinopril (ZESTRIL) 20 MG tablet Take 20 mg by mouth daily.   Yes [provider]  Melatonin 3 MG TABS Take 3 tablets by mouth at bedtime.   Yes [provider]  metFORMIN (GLUCOPHAGE) 500 MG tablet Take 500 mg by mouth daily. 06/28/20  Yes [provider]  omeprazole (PRILOSEC) 20 MG capsule Take 20 mg by mouth daily.   Yes [provider]  Polyvinyl Alcohol-Povidone (REFRESH OP) Place 1 drop into the right eye in the morning and at bedtime.   Yes [provider]  QUEtiapine (SEROQUEL) 25 MG tablet Take 25 mg by mouth at bedtime.   Yes [provider]  riluzole (RILUTEK) 50 MG tablet Take 50 mg by mouth in the morning and at bedtime.   Yes  [provider]  timolol (BETIMOL) 0.5 % ophthalmic solution Place 1 drop into the left eye 2 (two) times daily.   Yes [provider]  UNABLE TO FIND CPAP 16 at night.   Yes [provider]  UNABLE TO FIND Place 1 spray into the nose 2 (two) times daily. Med Name: Fluticasone nasal spray   Yes [provider]  zolpidem (AMBIEN) 10 MG tablet Take 5 mg by mouth at bedtime.    Yes [provider]    Physical Exam: Vitals:   12/22/20 1300 12/22/20 1600 12/22/20 1615 12/22/20 1630  BP: (!) 175/88 (!) 184/92  (!) 177/84  Pulse: (!) 102 (!) 108 (!) 103 (!) 107  Resp: 20 (!) 33 (!) 43 (!) 40  Temp:      TempSrc:      SpO2: 92% 94% 93% 93%  Weight:  Height:        Constitutional: No chest pain, warm to touch, tachypnea appreciated, unable to speak in full sentences. Vitals:   12/22/20 1300 12/22/20 1600 12/22/20 1615 12/22/20 1630  BP: (!) 175/88 (!) 184/92  (!) 177/84  Pulse: (!) 102 (!) 108 (!) 103 (!) 107  Resp: 20 (!) 33 (!) 43 (!) 40  Temp:      TempSrc:      SpO2: 92% 94% 93% 93%  Weight:      Height:       Eyes: PERRL, lids and conjunctivae normal, no icterus, no nystagmus. ENMT: Mucous membranes are moist. Posterior pharynx clear of any exudate or lesions. Neck: normal, supple, no masses, no thyromegaly, no JVD. Respiratory: Positive rhonchi bilaterally; positive tachypnea; no really able to use accessory muscles secondary to underlying ALS disease.  4 L nasal cannula supplementation in place.  Weak cough and effort appreciated. Cardiovascular: Sinus rhythm, mild tachycardia; no rubs, no gallops, no JVD. Abdomen: no tenderness, no masses palpated. No hepatosplenomegaly. Bowel sounds positive.  Musculoskeletal: no clubbing / cyanosis.  No joint deformity appreciated.  No contractures. Skin: no petechiae. Neurologic: No new focal deficits; chronic ascending weakness from ALS unchanged. Psychiatric: Normal judgment and insight.  Alert and oriented x 3.  Stable mood.  Labs on Admission: I have personally reviewed following labs and imaging studies  CBC: Recent Labs  Lab 12/22/20 0950  WBC 14.1*  NEUTROABS 12.7*  HGB 15.5  HCT 46.7  MCV 88.1  PLT 932    Basic Metabolic Panel: Recent Labs  Lab 12/22/20 0950  NA 138  K 4.2  CL 99  CO2 30  GLUCOSE 152*  BUN 9  CREATININE 0.55*  CALCIUM 8.9    GFR: Estimated Creatinine Clearance: 78 mL/min (A) (by C-G formula based on SCr of 0.55 mg/dL (L)).  Liver Function Tests: Recent Labs  Lab 12/22/20 0950  AST 22  ALT 20  ALKPHOS 69  BILITOT 1.1  PROT 7.3  ALBUMIN 4.0    Urine analysis:    Component Value Date/Time   COLORURINE YELLOW 12/22/2020 1119   APPEARANCEUR CLEAR 12/22/2020 1119   LABSPEC 1.012 12/22/2020 1119   PHURINE 5.0 12/22/2020 1119   GLUCOSEU NEGATIVE 12/22/2020 1119   HGBUR NEGATIVE 12/22/2020 1119   BILIRUBINUR NEGATIVE 12/22/2020 1119   KETONESUR 5 (A) 12/22/2020 1119   PROTEINUR NEGATIVE 12/22/2020 1119   UROBILINOGEN 1.0 03/21/2009 1002   NITRITE NEGATIVE 12/22/2020 1119   [place] 12/22/2020 1119    Radiological Exams on Admission: DG Chest Port 1 View  Result Date: 12/22/2020 CLINICAL DATA:  Cough with difficulty breathing since recent flu vaccination. Questionable sepsis. EXAM: PORTABLE CHEST 1 VIEW COMPARISON:  Radiographs 03/21/2009 and 11/28/2004. FINDINGS: 1016 hours. Lower lung volumes with probable resulting mild atelectasis at both lung bases. No confluent airspace opacity, pleural effusion or pneumothorax identified. The heart size and mediastinal contours are stable for technique. The bones appear unchanged. Telemetry leads overlie the chest. IMPRESSION: Probable mild bibasilar atelectasis associated with lower lung volumes. No evidence of pneumonia. Electronically Signed   By: Richardean Sale M.D.   On: 12/22/2020 10:25    EKG: Independently reviewed.  Sinus tachycardia; no acute ischemic  changes.  Assessment/Plan 1-acute respiratory failure with hypoxia in the setting of presumed aspiration pneumonia. -No active infiltrates appreciated on x-ray; positive bilateral atelectasis reported. -Patient meeting SIRS criteria but given the lack of source unable to qualify him for sepsis. -Outside mild tachycardia and tachypnea his  blood pressure is a stable and with 4 L nasal cannula supplementation stable saturation. -Patient has been started on Unasyn, steroids, Mucinex, nebulizer management, chest vest and as needed use of the trilogy/BiPAP. -Patient is DNR/DNI -Normal lactic acid and negative respiratory panel at time of admission. -Will provide supportive care, fluid resuscitation and follow clinical response. -ABG demonstrating pH 7.39, CO2 47, O2 78.4 and HCO3 27.1.  2-DNR/DNI -CODE STATUS has been confirmed by patient himself and also his wife at bedside. -They are okay with the use of BiPAP if required. -Patient at high risk for decompensation and further deterioration given ongoing advanced ALS condition.  3-type 2 diabetes mellitus -Will check A1c -Sliding scale insulin and Levemir will be provided while inpatient and -Anticipating increasing his blood sugar level while using steroids.  4-gastroesophageal reflux disease -Continue the use of PPI  5-ALS -Continue outpatient follow-up with [facility] neurology service -Continue the use of Rilutek. -Trilogy/BiPAP as needed and nightly to be provided.  6-history of glaucoma -Continue the use of timolol as previously prescribed -Continue also the use of Liquifilm Tears to assist with dry eyes.  7-depression/anxiety -Overall stable mood -Continue Seroquel, Elavil, Cymbalta and as needed Xanax  8-hyperlipidemia -Continue statins  9-hypertension -Continue the use of lisinopril -Follow vital signs.  DVT prophylaxis: Lovenox Code Status:   DNR/DNI Family Communication:  Wife at bedside. Disposition Plan:    Patient is from:  Home  Anticipated DC to:  Hopefully home  Anticipated DC date:  To be determined  Anticipated DC barriers: Stabilization of respiratory status and further improvement of ongoing SIRS criteria.  Consults called:  None Admission status:  Inpatient, stepdown; anticipating more than 2 midnight length of stay.  Severity of Illness: The appropriate patient status for this patient is INPATIENT. Inpatient status is judged to be reasonable and necessary in order to provide the required intensity of service to ensure the patient's safety. The patient's presenting symptoms, physical exam findings, and initial radiographic and laboratory data in the context of their chronic comorbidities is felt to place them at high risk for further clinical deterioration. Furthermore, it is not anticipated that the patient will be medically stable for discharge from the hospital within 2 midnights of admission.   * I certify that at the point of admission it is my clinical judgment that the patient will require inpatient hospital care spanning beyond 2 midnights from the point of admission due to high intensity of service, high risk for further deterioration and high frequency of surveillance required.Barton Dubois MD [facility]  How to contact the [facility] Attending or Consulting provider [place] or covering provider during after hours [place], for this patient?   Check the care team in [facility] and look for a) attending/consulting [facility] provider listed and b) the [facility] team listed Log into [email or web address] and use [place]'s universal password to access. If you do not have the password, please contact the hospital operator. Locate the [facility] provider you are looking for under [facility] and page to a number that you can be directly reached. If you still have difficulty reaching the provider, please page the [facility] (Director on Call) for the Hospitalists listed on amion for  assistance.  12/22/2020, 5:58 PM

## 2020-12-22 NOTE — Progress Notes (Addendum)
Pharmacy Antibiotic Note  Javier Cook is a 70 y.o. male admitted on 12/22/2020 with aspiration PNA.  Pharmacy has been consulted for Unasyn dosing.  Plan: Unasyn 3gm IV q6h F/U cxs and clinical progress Monitor V/S, labs, and levels as indicated  Height: 5\' 7"  (170.2 cm) Weight: 73.9 kg (163 lb) IBW/kg (Calculated) : 66.1  Temp (24hrs), Avg:99.3 F (37.4 C), Min:98.4 F (36.9 C), Max:100.2 F (37.9 C)  Recent Labs  Lab 12/22/20 0950 12/22/20 1037  WBC 14.1*  --   CREATININE 0.55*  --   LATICACIDVEN  --  1.2    Estimated Creatinine Clearance: 78 mL/min (A) (by C-G formula based on SCr of 0.55 mg/dL (L)).    No Known Allergies  Antimicrobials this admission: Unasyn 11/11 Vancomycin,  Cefepime, and Flagyl IV 11/11 x 1 dose in ED  Microbiology results: 11/11 BCx: pending  MRSA PCR:   Thank you for allowing pharmacy to be a part of this patient's care.  Isac Sarna, BS [place], [place] Clinical Pharmacist Pager [phone] 12/22/2020 2:17 PM

## 2020-12-22 NOTE — ED Notes (Signed)
Attempted to call report x 1  

## 2020-12-22 NOTE — Sepsis Progress Note (Signed)
eLink is following this Code Sepsis. °

## 2020-12-22 NOTE — ED Notes (Signed)
Pt bed linens changed due to bowel incontinence. Brief placed on pt

## 2020-12-22 NOTE — ED Notes (Signed)
Pt wife has been to nurses station multiple times, she is requesting that he be transferred to [place] nurse in to speak with Pt wife at this time.

## 2020-12-22 NOTE — ED Notes (Signed)
Pt has intermittent tremors, high reading for respirations on monitor. On assessment counting respirations patient is at 22 RR/Minute

## 2020-12-22 NOTE — ED Notes (Signed)
Due to ALS patient unable to lift arms

## 2020-12-22 NOTE — ED Notes (Signed)
Pt bed linens changed due to urinary incontinence and pt helped into gown. Pt belongings placed into a bag, wife notified of this.

## 2020-12-22 NOTE — Progress Notes (Signed)
**Note De-Identified  Obfuscation** ABG collected and walked over to lab.

## 2020-12-23 ENCOUNTER — Inpatient Hospital Stay (HOSPITAL_COMMUNITY): Payer: No Typology Code available for payment source

## 2020-12-23 ENCOUNTER — Encounter (HOSPITAL_COMMUNITY): Payer: Self-pay | Admitting: Internal Medicine

## 2020-12-23 DIAGNOSIS — F418 Other specified anxiety disorders: Secondary | ICD-10-CM

## 2020-12-23 DIAGNOSIS — K219 Gastro-esophageal reflux disease without esophagitis: Secondary | ICD-10-CM

## 2020-12-23 DIAGNOSIS — Z66 Do not resuscitate: Secondary | ICD-10-CM

## 2020-12-23 LAB — BASIC METABOLIC PANEL
Anion gap: 6 (ref 5–15)
BUN: 10 mg/dL (ref 8–23)
CO2: 26 mmol/L (ref 22–32)
Calcium: 8 mg/dL — ABNORMAL LOW (ref 8.9–10.3)
Chloride: 103 mmol/L (ref 98–111)
Creatinine, Ser: 0.51 mg/dL — ABNORMAL LOW (ref 0.61–1.24)
GFR, Estimated: >60 mL/min (ref >60)
Glucose, Bld: 144 mg/dL — ABNORMAL HIGH (ref 70–99)
Potassium: 3.7 mmol/L (ref 3.5–5.1)
Sodium: 135 mmol/L (ref 135–145)

## 2020-12-23 LAB — CBC
HCT: 34.1 % — ABNORMAL LOW (ref 39.0–52.0)
Hemoglobin: 11.8 g/dL — ABNORMAL LOW (ref 13.0–17.0)
MCH: 29.8 pg (ref 26.0–34.0)
MCHC: 34.6 g/dL (ref 30.0–36.0)
MCV: 86.1 fL (ref 80.0–100.0)
Platelets: 179 10*3/uL (ref 150–400)
RBC: 3.96 MIL/uL — ABNORMAL LOW (ref 4.22–5.81)
RDW: 13.6 % (ref 11.5–15.5)
WBC: 14.8 10*3/uL — ABNORMAL HIGH (ref 4.0–10.5)
nRBC: 0 % (ref 0.0–0.2)

## 2020-12-23 LAB — GLUCOSE, CAPILLARY
Glucose-Capillary: 214 mg/dL — ABNORMAL HIGH (ref 70–99)
Glucose-Capillary: 178 mg/dL — ABNORMAL HIGH (ref 70–99)
Glucose-Capillary: 168 mg/dL — ABNORMAL HIGH (ref 70–99)
Glucose-Capillary: 202 mg/dL — ABNORMAL HIGH (ref 70–99)

## 2020-12-23 LAB — MAGNESIUM: Magnesium: 1.6 mg/dL — ABNORMAL LOW (ref 1.7–2.4)

## 2020-12-23 LAB — STREP PNEUMONIAE URINARY ANTIGEN: Strep Pneumo Urinary Antigen: NEGATIVE

## 2020-12-23 LAB — MRSA NEXT GEN BY PCR, NASAL: MRSA by PCR Next Gen: NOT DETECTED

## 2020-12-23 LAB — PHOSPHORUS: Phosphorus: 2.3 mg/dL — ABNORMAL LOW (ref 2.5–4.6)

## 2020-12-23 MED ORDER — MAGNESIUM SULFATE 2 GM/50ML IV SOLN
2.0000 g | Freq: Once | INTRAVENOUS | Status: AC
  Administered 2020-12-23: 2 g via INTRAVENOUS
  Filled 2020-12-23: qty 50

## 2020-12-23 MED ORDER — POTASSIUM PHOSPHATES 15 MMOLE/5ML IV SOLN
30.0000 mmol | Freq: Once | INTRAVENOUS | Status: AC
  Administered 2020-12-23: 30 mmol via INTRAVENOUS
  Filled 2020-12-23: qty 10

## 2020-12-23 MED ORDER — CHLORHEXIDINE GLUCONATE CLOTH 2 % EX PADS
6.0000 | MEDICATED_PAD | Freq: Every day | CUTANEOUS | Status: DC
  Administered 2020-12-23 – 2020-12-26 (×4): 6 via TOPICAL

## 2020-12-23 MED ORDER — BUDESONIDE 0.5 MG/2ML IN SUSP
0.5000 mg | Freq: Two times a day (BID) | RESPIRATORY_TRACT | Status: DC
  Administered 2020-12-23 – 2020-12-26 (×6): 0.5 mg via RESPIRATORY_TRACT
  Filled 2020-12-23 (×7): qty 2

## 2020-12-23 MED ORDER — HYDRALAZINE HCL 20 MG/ML IJ SOLN
10.0000 mg | Freq: Three times a day (TID) | INTRAMUSCULAR | Status: DC | PRN
  Administered 2020-12-23 – 2020-12-25 (×3): 10 mg via INTRAVENOUS
  Filled 2020-12-23 (×3): qty 1

## 2020-12-23 MED ORDER — ORAL CARE MOUTH RINSE
15.0000 mL | Freq: Two times a day (BID) | OROMUCOSAL | Status: DC
  Administered 2020-12-24 – 2020-12-26 (×5): 15 mL via OROMUCOSAL

## 2020-12-23 NOTE — Progress Notes (Signed)
PROGRESS NOTE    Javier Cook  ENI:[id number] DOB: Apr 27, 1950 DOA: 12/22/2020 PCP: Lavone Orn, MD    Chief Complaint  Patient presents with   Shortness of Breath    Brief Narrative:  Javier Cook is a 70 y.o. 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  Patient and wife reported that he was vaccinated against flu on Wednesday (12/20/2020), and onset Thursday started developing URI symptoms that had rapidly progressed to the point of no receiving relief by the use of his trilogy machine at home.  Due to his underlying history of ALS patient not really able to properly expectorate and ended developing increased work of breathing.  EMS was contacted and on their arrival patient was found to be hypoxic into the mid 70s on room air; he was placed on nonrebreather with improvement in his saturation and then able to been titrated down to 4 L nasal cannula with O2 sats in the mid 90s.    No vaccination records again copy appreciated on his chart; COVID PCR and influenza negative while in the ED.   ED Course: Patient was tachypneic, tachycardic and hypoxic requiring oxygen supplementation.  Increased work of breathing and difficulty speaking in full sentences appreciated on exam.  Chest x-ray has failed to demonstrate any infiltrates or active cardiopulmonary process.  Given patient history and risk for decompensation while presenting SIRS Criteria, [facility] has been contacted to place in the hospital for further evaluation and management.  Cultures taken and empiric antibiotic started with presumption of aspiration pneumonia.    Assessment & Plan: 1-acute respiratory failure with hypoxia in the setting of presumed aspiration  pneumonia -Patient demonstrated improvement in his respiratory status with current intervention -No requiring BiPAP at this time -Good oxygen saturation while using 4-5 L nasal cannula supplementation -Expressed breathing easier. -Continue empiric antibiotics, steroids and nebulizer management. -Repeat chest x-ray in a.m. -Patient had acute risk for decompensation given underlying history of ALS. -Continue as needed use of trilogy/BiPAP. -Continue the use of flutter valve, chest vest for physiotherapy and also Mucinex. -Strep pneumo antigen negative -Legionella antigen pending.  2-DNR/DNI -CODE STATUS once again confirmed with patient and wife at bedside -Continue current management as mentioned above.  3-gastroesophageal reflux disease/GI prophylaxis -Continue PPI.  4-Type 2 diabetes -Continue sliding scale insulin and the use of Levemir -A1c 5.3 -Current fluctuation in his blood sugar most likely trigger by steroids usage  5-history of ALS -Continue outpatient follow-up with [place] neurology service -Continue the use of Rilutek -Continue trilogy/BiPAP as needed and nightly as discussed above. -Overall prognosis guarded.  6-depression/anxiety -Stable mood -No hallucinations or agitation -Continue Seroquel, Elavil, Cymbalta and as needed Xanax.  7-hypertension -Continue the use of lisinopril -In the setting of his steroid usage blood pressure has been elevated; as needed hydralazine has been added.  8-hyperlipidemia -No longer using a statins as this has been seen on the studies that could potentially worsen ALS. -Discussed heart healthy diet.  9-history of glaucoma -Continue the use of Timolol as previously prescribed.  DVT prophylaxis: Lovenox Code Status: DNR Family Communication: Wife at bedside. Disposition:   Status is: Inpatient   Consultants:  None  Procedures:  -See below for x-ray reports -Lower extremity Dopplers negative for  DVT.  Antimicrobials:  Unasyn   Subjective:  Reports feeling better, some improvement in his respiratory status appreciated.  Patient is afebrile.  Objective: Vitals:   12/23/20 1000 12/23/20 1100 12/23/20 1600 12/23/20 1610  BP: (!) 157/73  (!) 186/84 (!) 190/90  Pulse: 85  69 71  Resp: 16  (!) 28 (!) 27  Temp:  98.7 F (37.1 C)  97.9 F (36.6 C)  TempSrc:  Axillary  Oral  SpO2: 91%  91% 97%  Weight:      Height:        Intake/Output Summary (Last 24 hours) at 12/23/2020 1811 Last data filed at 12/23/2020 1626 Gross per 24 hour  Intake 925.95 ml  Output 1651 ml  Net -725.05 ml   Filed Weights   12/22/20 0935  Weight: 73.9 kg    Examination:  General exam: Appears calmer and demonstrating improvement in his respiratory status.  Patient expressed to be hungry and would like to have his diet advanced. Respiratory system: Positive rhonchi bilaterally, weak coughing spells and overall decreased respiratory effort (patient reported this not to be new and Aligned with his ALS) Cardiovascular system: S1 & S2 heard, RRR. No JVD, murmurs, rubs, gallops or clicks. No pedal edema. Gastrointestinal system: Abdomen is nondistended, soft and nontender. No organomegaly or masses felt. Normal bowel sounds heard. Central nervous system: Alert and oriented.  No new focal deficits.  Ascending weakness on change from his baseline currently. Extremities: No cyanosis or clubbing. Skin: No rashes, no petechiae. Psychiatry: Judgement and insight appear normal. Mood & affect appropriate.     Data Reviewed: I have personally reviewed following labs and imaging studies  CBC: Recent Labs  Lab 12/22/20 0950 12/23/20 0604  WBC 14.1* 14.8*  NEUTROABS 12.7*  --   HGB 15.5 11.8*  HCT 46.7 34.1*  MCV 88.1 86.1  PLT 203 818    Basic Metabolic Panel: Recent Labs  Lab 12/22/20 0950 12/23/20 0604  NA 138 135  K 4.2 3.7  CL 99 103  CO2 30 26  GLUCOSE 152* 144*  BUN 9 10  CREATININE  0.55* 0.51*  CALCIUM 8.9 8.0*  MG  --  1.6*  PHOS  --  2.3*    GFR: Estimated Creatinine Clearance: 78 mL/min (A) (by C-G formula based on SCr of 0.51 mg/dL (L)).  Liver Function Tests: Recent Labs  Lab 12/22/20 0950  AST 22  ALT 20  ALKPHOS 69  BILITOT 1.1  PROT 7.3  ALBUMIN 4.0    CBG: Recent Labs  Lab 12/22/20 1636 12/22/20 2155 12/23/20 0914 12/23/20 1211 12/23/20 1616  GLUCAP 123* 192* 214* 178* 168*     Recent Results (from the past 240 hour(s))  Resp Panel by RT-PCR (Flu A&B, Covid) Nasopharyngeal Swab     Status: None   Collection Time: 12/22/20 10:19 AM   Specimen: Nasopharyngeal Swab; Nasopharyngeal(NP) swabs in vial transport medium  Result Value Ref Range Status   SARS Coronavirus 2 by RT PCR NEGATIVE NEGATIVE Final    Comment: (NOTE) SARS-CoV-2 target nucleic acids are NOT DETECTED.  The SARS-CoV-2 RNA is generally detectable in upper respiratory specimens during the acute phase of infection. The lowest concentration of SARS-CoV-2 viral copies this assay can detect is 138 copies/mL. A negative result does not preclude SARS-Cov-2 infection and should not be used as the sole basis for treatment or other patient management decisions. A negative result may occur with  improper specimen collection/handling, submission of specimen other than nasopharyngeal swab, presence of viral mutation(s) within the areas targeted by this assay,  and inadequate number of viral copies(<138 copies/mL). A negative result must be combined with clinical observations, patient history, and epidemiological information. The expected result is Negative.  Fact Sheet for Patients:  EntrepreneurPulse.com.au  Fact Sheet for Healthcare Providers:  IncredibleEmployment.be  This test is no t yet approved or cleared by the [place] FDA and  has been authorized for detection and/or diagnosis of SARS-CoV-2 by FDA under an Emergency Use  Authorization (EUA). This EUA will remain  in effect (meaning this test can be used) for the duration of the COVID-19 declaration under Section 564(b)(1) of the Act, 21 U.S.C.section 360bbb-3(b)(1), unless the authorization is terminated  or revoked sooner.       Influenza A by PCR NEGATIVE NEGATIVE Final   Influenza B by PCR NEGATIVE NEGATIVE Final    Comment: (NOTE) The Xpert Xpress SARS-CoV-2/FLU/RSV plus assay is intended as an aid in the diagnosis of influenza from Nasopharyngeal swab specimens and should not be used as a sole basis for treatment. Nasal washings and aspirates are unacceptable for Xpert Xpress SARS-CoV-2/FLU/RSV testing.  Fact Sheet for Patients: EntrepreneurPulse.com.au  Fact Sheet for Healthcare Providers: IncredibleEmployment.be  This test is not yet approved or cleared by the [place] FDA and has been authorized for detection and/or diagnosis of SARS-CoV-2 by FDA under an Emergency Use Authorization (EUA). This EUA will remain in effect (meaning this test can be used) for the duration of the COVID-19 declaration under Section 564(b)(1) of the Act, 21 U.S.C. section 360bbb-3(b)(1), unless the authorization is terminated or revoked.  Performed at [facility], [address], [place], [place] [address]   Blood Culture (routine x 2)     Status: None (Preliminary result)   Collection Time: 12/22/20 10:36 AM   Specimen: BLOOD LEFT ARM  Result Value Ref Range Status   Specimen Description BLOOD LEFT ARM  Final   Special Requests   Final    Immunocompromised BOTTLES DRAWN AEROBIC AND ANAEROBIC Blood Culture adequate volume   Culture   Final    NO GROWTH < 24 HOURS Performed at [facility], [address], [place], [place] [address]    Report Status PENDING  Incomplete  Blood Culture (routine x 2)     Status: None (Preliminary result)   Collection Time: 12/22/20 10:41 AM   Specimen: BLOOD LEFT ARM  Result Value  Ref Range Status   Specimen Description BLOOD LEFT ARM  Final   Special Requests   Final    Immunocompromised BOTTLES DRAWN AEROBIC AND ANAEROBIC Blood Culture adequate volume   Culture   Final    NO GROWTH < 24 HOURS Performed at [facility], [address] [place], [place] [address]    Report Status PENDING  Incomplete  MRSA Next Gen by PCR, Nasal     Status: None   Collection Time: 12/22/20  9:50 PM   Specimen: Nasal Mucosa; Nasal Swab  Result Value Ref Range Status   MRSA by PCR Next Gen NOT DETECTED NOT DETECTED Final    Comment: (NOTE) The GeneXpert MRSA Assay (FDA approved for NASAL specimens only), is one component of a comprehensive MRSA colonization surveillance program. It is not intended to diagnose MRSA infection nor to guide or monitor treatment for MRSA infections. Test performance is not FDA approved in patients less than 16 years old. Performed at [facility], [address], [place], [place] [address]      Radiology Studies: US Venous Img Lower Bilateral (DVT)  Result Date: 12/23/2020 CLINICAL DATA:  Shortness of breath.  Elevated D-dimer. EXAM: BILATERAL LOWER EXTREMITY VENOUS DOPPLER ULTRASOUND TECHNIQUE: Gray-scale sonography with graded compression, as well as color Doppler and duplex ultrasound were performed to evaluate the lower extremity deep venous systems from the level of the common femoral vein and including the common femoral, femoral, profunda femoral, popliteal and calf veins including the posterior tibial, peroneal and gastrocnemius veins when visible. The superficial great saphenous vein was also interrogated. Spectral Doppler was utilized to evaluate flow at rest and with distal augmentation maneuvers in the common femoral, femoral and popliteal veins. COMPARISON:  Chest radiograph, 12/22/2020. FINDINGS: RIGHT LOWER EXTREMITY Normal compressibility of the RIGHT common femoral, superficial femoral, and popliteal veins, as well as the visualized calf  veins. Visualized portions of profunda femoral vein and great saphenous vein unremarkable. No filling defects to suggest DVT on grayscale or color Doppler imaging. Doppler waveforms show normal direction of venous flow, normal respiratory plasticity and response to augmentation. OTHER No evidence of superficial thrombophlebitis or abnormal fluid collection. LEFT LOWER EXTREMITY Normal compressibility of the LEFT common femoral, superficial femoral, and popliteal veins, as well as the visualized calf veins. Visualized portions of profunda femoral vein and great saphenous vein unremarkable. No filling defects to suggest DVT on grayscale or color Doppler imaging. Doppler waveforms show normal direction of venous flow, normal respiratory plasticity and response to augmentation. OTHER No evidence of superficial thrombophlebitis or abnormal fluid collection. IMPRESSION: No evidence of femoropopliteal DVT within either lower extremity. Michaelle Birks, MD [facility] Radiology Electronically Signed   By: Michaelle Birks M.D.   On: 12/23/2020 14:15   DG Chest Port 1 View  Result Date: 12/22/2020 CLINICAL DATA:  Cough with difficulty breathing since recent flu vaccination. Questionable sepsis. EXAM: PORTABLE CHEST 1 VIEW COMPARISON:  Radiographs 03/21/2009 and 11/28/2004. FINDINGS: 1016 hours. Lower lung volumes with probable resulting mild atelectasis at both lung bases. No confluent airspace opacity, pleural effusion or pneumothorax identified. The heart size and mediastinal contours are stable for technique. The bones appear unchanged. Telemetry leads overlie the chest. IMPRESSION: Probable mild bibasilar atelectasis associated with lower lung volumes. No evidence of pneumonia. Electronically Signed   By: Richardean Sale M.D.   On: 12/22/2020 10:25     Scheduled Meds:  amitriptyline  10 mg Oral BID   aspirin EC  81 mg Oral Daily   budesonide (PULMICORT) nebulizer  solution  0.5 mg Nebulization BID   dextromethorphan-guaiFENesin  1 tablet Oral BID   DULoxetine  60 mg Oral Daily   enoxaparin (LOVENOX) injection  40 mg Subcutaneous Q24H   fluticasone  1 spray Each Nare Daily   insulin aspart  0-15 Units Subcutaneous TID WC   insulin aspart  0-5 Units Subcutaneous QHS   insulin detemir  20 Units Subcutaneous QHS   ipratropium-albuterol  3 mL Nebulization QID   lisinopril  20 mg Oral Daily   methylPREDNISolone (SOLU-MEDROL) injection  40 mg Intravenous Q12H   pantoprazole  40 mg Oral Daily   polyvinyl alcohol  1 drop Both Eyes BID   QUEtiapine  25 mg Oral QHS   riluzole  50 mg Oral Q12H   timolol  1 drop Left Eye BID   Continuous Infusions:  ampicillin-sulbactam (UNASYN) IV 3 g (12/23/20 1246)     LOS: 1 day    Time spent: 35 minutes    Barton Dubois, MD [facility]   To contact the attending provider between 7A-7P or the covering provider during after hours  7P-7A, please log into the web site [email or web address] and access using universal [place] password for that web site. If you do not have the password, please call the hospital operator.  12/23/2020, 6:11 PM

## 2020-12-23 NOTE — Progress Notes (Signed)
Pt on home machine for hs

## 2020-12-24 ENCOUNTER — Inpatient Hospital Stay (HOSPITAL_COMMUNITY): Payer: No Typology Code available for payment source

## 2020-12-24 LAB — BASIC METABOLIC PANEL
Anion gap: 7 (ref 5–15)
BUN: 15 mg/dL (ref 8–23)
CO2: 25 mmol/L (ref 22–32)
Calcium: 8.4 mg/dL — ABNORMAL LOW (ref 8.9–10.3)
Chloride: 103 mmol/L (ref 98–111)
Creatinine, Ser: 0.41 mg/dL — ABNORMAL LOW (ref 0.61–1.24)
GFR, Estimated: >60 mL/min (ref >60)
Glucose, Bld: 191 mg/dL — ABNORMAL HIGH (ref 70–99)
Potassium: 3.9 mmol/L (ref 3.5–5.1)
Sodium: 135 mmol/L (ref 135–145)

## 2020-12-24 LAB — CBC
HCT: 38.2 % — ABNORMAL LOW (ref 39.0–52.0)
Hemoglobin: 12.7 g/dL — ABNORMAL LOW (ref 13.0–17.0)
MCH: 28.4 pg (ref 26.0–34.0)
MCHC: 33.2 g/dL (ref 30.0–36.0)
MCV: 85.5 fL (ref 80.0–100.0)
Platelets: 203 10*3/uL (ref 150–400)
RBC: 4.47 MIL/uL (ref 4.22–5.81)
RDW: 13.7 % (ref 11.5–15.5)
WBC: 14.4 10*3/uL — ABNORMAL HIGH (ref 4.0–10.5)
nRBC: 0 % (ref 0.0–0.2)

## 2020-12-24 LAB — GLUCOSE, CAPILLARY
Glucose-Capillary: 187 mg/dL — ABNORMAL HIGH (ref 70–99)
Glucose-Capillary: 236 mg/dL — ABNORMAL HIGH (ref 70–99)
Glucose-Capillary: 134 mg/dL — ABNORMAL HIGH (ref 70–99)
Glucose-Capillary: 216 mg/dL — ABNORMAL HIGH (ref 70–99)

## 2020-12-24 LAB — MAGNESIUM: Magnesium: 2 mg/dL (ref 1.7–2.4)

## 2020-12-24 LAB — PHOSPHORUS: Phosphorus: 2.7 mg/dL (ref 2.5–4.6)

## 2020-12-24 MED ORDER — AMOXICILLIN-POT CLAVULANATE 875-125 MG PO TABS
1.0000 | ORAL_TABLET | Freq: Two times a day (BID) | ORAL | Status: DC
  Administered 2020-12-24 – 2020-12-26 (×5): 1 via ORAL
  Filled 2020-12-24 (×5): qty 1

## 2020-12-24 NOTE — Progress Notes (Signed)
PROGRESS NOTE    Javier Cook  VFI:[id number] DOB: 1950-09-15 DOA: 12/22/2020 PCP: Lavone Orn, MD    Chief Complaint  Patient presents with   Shortness of Breath    Brief Narrative:  Javier Cook is a 70 y.o. 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  Patient and wife reported that he was vaccinated against flu on Wednesday (12/20/2020), and onset Thursday started developing URI symptoms that had rapidly progressed to the point of no receiving relief by the use of his trilogy machine at home.  Due to his underlying history of ALS patient not really able to properly expectorate and ended developing increased work of breathing.  EMS was contacted and on their arrival patient was found to be hypoxic into the mid 70s on room air; he was placed on nonrebreather with improvement in his saturation and then able to been titrated down to 4 L nasal cannula with O2 sats in the mid 90s.    No vaccination records again copy appreciated on his chart; COVID PCR and influenza negative while in the ED.   ED Course: Patient was tachypneic, tachycardic and hypoxic requiring oxygen supplementation.  Increased work of breathing and difficulty speaking in full sentences appreciated on exam.  Chest x-ray has failed to demonstrate any infiltrates or active cardiopulmonary process.  Given patient history and risk for decompensation while presenting SIRS Criteria, TRH has been contacted to place in the hospital for further evaluation and management.  Cultures taken and empiric antibiotic started with presumption of aspiration pneumonia.    Assessment & Plan: 1-acute respiratory failure with hypoxia in the setting of presumed aspiration  pneumonia -Patient demonstrated improvement in his respiratory status with current intervention -No requiring BiPAP at this time -Good oxygen saturation while using 4-5 L nasal cannula supplementation. -patient is afebrile -still SOB and experiencing coughing spells. -Experienced episode of choking while eating; SPL consulted. -antibiotics switched to oral route using augmentin. -continue nebulizer management and steroids -Repeat chest x-ray demonstrating no acute infiltrates. -Patient had acute risk for decompensation given underlying history of ALS. -Continue as needed use of trilogy/BiPAP at bedtime. -Continue the use of flutter valve, chest vest for physiotherapy and also Mucinex. -Strep pneumo antigen negative -Legionella antigen pending.  2-DNR/DNI -CODE STATUS once again confirmed with patient and wife at bedside -Continue current management as mentioned above.  3-gastroesophageal reflux disease/GI prophylaxis -Continue PPI.  4-Type 2 diabetes -Continue sliding scale insulin and the use of Levemir -A1c 5.3 -Current fluctuation in his blood sugar most likely trigger by steroids usage  5-history of ALS -Continue outpatient follow-up with [facility] neurology service -Continue the use of Rilutek -Continue trilogy/BiPAP as needed and nightly as discussed above. -Overall prognosis remains guarded.  6-depression/anxiety -Stable mood -No hallucinations or agitation -Continue Seroquel, Elavil, Cymbalta and as needed Xanax.  7-hypertension -Continue the use of lisinopril -In the setting of his steroid usage blood pressure has been elevated; as needed hydralazine has been added.  8-hyperlipidemia -No longer using a statins as this has been seen on the studies that could potentially worsen ALS. -Discussed heart healthy diet.  9-history of glaucoma -Continue the use of Timolol as previously prescribed.  DVT prophylaxis: Lovenox Code Status: DNR Family Communication: no  family at bedside. Disposition:   Status is: Inpatient  Consultants:  None  Procedures:  -See below for x-ray reports -Lower extremity Dopplers negative for DVT.  Antimicrobials:  Unasyn   Subjective: Patient is afebrile, still complaining of coughing spells and SOB, even improved. Choking episode while eating appreciated. No nausea, no vomiting. Using 4-5L [place].  Objective: Vitals:   12/24/20 0912 12/24/20 0924 12/24/20 0927 12/24/20 1121  BP: (!) 163/90     Pulse:   99   Resp:  19    Temp:    98.4 F (36.9 C)  TempSrc:    Oral  SpO2:      Weight:      Height:        Intake/Output Summary (Last 24 hours) at 12/24/2020 1220 Last data filed at 12/24/2020 1287 Gross per 24 hour  Intake 1213.41 ml  Output 2375 ml  Net -1161.59 ml   Filed Weights   12/22/20 0935  Weight: 73.9 kg    Examination: General exam: Alert, awake, oriented x 3, bilaterally rhonchi, using 4-5L ; no CP. Ongoing coughing spells and SOB. Positive choking episode earlier.  Respiratory system: positive rhonchi bilaterally, no wheezing. Weak resp effort. Cardiovascular system:RRR. No murmurs, rubs, gallops. No JVD Gastrointestinal system: Abdomen is nondistended, soft and nontender. No organomegaly or masses felt. Normal bowel sounds heard. Central nervous system: Alert and oriented. No focal neurological deficits. Extremities: No Cyanosis, no clubbing. Skin: No petechiae. Psychiatry: Judgement and insight appear normal. Mood & affect appropriate.   Data Reviewed: I have personally reviewed following labs and imaging studies  CBC: Recent Labs  Lab 12/22/20 0950 12/23/20 0604 12/24/20 0543  WBC 14.1* 14.8* 14.4*  NEUTROABS 12.7*  --   --   HGB 15.5 11.8* 12.7*  HCT 46.7 34.1* 38.2*  MCV 88.1 86.1 85.5  PLT 203 179 867    Basic Metabolic Panel: Recent Labs  Lab 12/22/20 0950 12/23/20 0604 12/24/20 0543  NA 138 135 135  K 4.2 3.7 3.9  CL 99 103 103  CO2 30 26 25   GLUCOSE  152* 144* 191*  BUN 9 10 15   CREATININE 0.55* 0.51* 0.41*  CALCIUM 8.9 8.0* 8.4*  MG  --  1.6* 2.0  PHOS  --  2.3* 2.7    GFR: Estimated Creatinine Clearance: 78 mL/min (A) (by C-G formula based on SCr of 0.41 mg/dL (L)).  Liver Function Tests: Recent Labs  Lab 12/22/20 0950  AST 22  ALT 20  ALKPHOS 69  BILITOT 1.1  PROT 7.3  ALBUMIN 4.0    CBG: Recent Labs  Lab 12/23/20 1211 12/23/20 1616 12/23/20 2111 12/24/20 0756 12/24/20 1123  GLUCAP 178* 168* 202* 187* 236*     Recent Results (from the past 240 hour(s))  Resp Panel by RT-PCR (Flu A&B, Covid) Nasopharyngeal Swab     Status: None   Collection Time: 12/22/20 10:19 AM   Specimen: Nasopharyngeal Swab; Nasopharyngeal(NP) swabs in vial transport medium  Result Value Ref Range Status   SARS Coronavirus 2 by RT PCR NEGATIVE NEGATIVE Final    Comment: (NOTE) SARS-CoV-2 target nucleic acids are NOT DETECTED.  The SARS-CoV-2 RNA is generally detectable in upper respiratory specimens during the acute phase of infection. The lowest concentration of SARS-CoV-2 viral copies this assay can detect is 138 copies/mL. A negative result does not preclude SARS-Cov-2 infection and should not be used as the sole basis for treatment or other patient management decisions. A negative result may occur with  improper specimen collection/handling, submission of specimen other than nasopharyngeal swab, presence of  viral mutation(s) within the areas targeted by this assay, and inadequate number of viral copies(<138 copies/mL). A negative result must be combined with clinical observations, patient history, and epidemiological information. The expected result is Negative.  Fact Sheet for Patients:  EntrepreneurPulse.com.au  Fact Sheet for Healthcare Providers:  IncredibleEmployment.be  This test is no t yet approved or cleared by the [place] FDA and  has been authorized for detection and/or  diagnosis of SARS-CoV-2 by FDA under an Emergency Use Authorization (EUA). This EUA will remain  in effect (meaning this test can be used) for the duration of the COVID-19 declaration under Section 564(b)(1) of the Act, 21 U.S.C.section 360bbb-3(b)(1), unless the authorization is terminated  or revoked sooner.       Influenza A by PCR NEGATIVE NEGATIVE Final   Influenza B by PCR NEGATIVE NEGATIVE Final    Comment: (NOTE) The Xpert Xpress SARS-CoV-2/FLU/RSV plus assay is intended as an aid in the diagnosis of influenza from Nasopharyngeal swab specimens and should not be used as a sole basis for treatment. Nasal washings and aspirates are unacceptable for Xpert Xpress SARS-CoV-2/FLU/RSV testing.  Fact Sheet for Patients: EntrepreneurPulse.com.au  Fact Sheet for Healthcare Providers: IncredibleEmployment.be  This test is not yet approved or cleared by the [place] FDA and has been authorized for detection and/or diagnosis of SARS-CoV-2 by FDA under an Emergency Use Authorization (EUA). This EUA will remain in effect (meaning this test can be used) for the duration of the COVID-19 declaration under Section 564(b)(1) of the Act, 21 U.S.C. section 360bbb-3(b)(1), unless the authorization is terminated or revoked.  Performed at [facility], [address], [place], [place] [address]   Blood Culture (routine x 2)     Status: None (Preliminary result)   Collection Time: 12/22/20 10:36 AM   Specimen: BLOOD LEFT ARM  Result Value Ref Range Status   Specimen Description BLOOD LEFT ARM  Final   Special Requests   Final    Immunocompromised BOTTLES DRAWN AEROBIC AND ANAEROBIC Blood Culture adequate volume   Culture   Final    NO GROWTH 2 DAYS Performed at [facility], [address] [place], [place] [address]    Report Status PENDING  Incomplete  Blood Culture (routine x 2)     Status: None (Preliminary result)   Collection Time: 12/22/20  10:41 AM   Specimen: BLOOD LEFT ARM  Result Value Ref Range Status   Specimen Description BLOOD LEFT ARM  Final   Special Requests   Final    Immunocompromised BOTTLES DRAWN AEROBIC AND ANAEROBIC Blood Culture adequate volume   Culture   Final    NO GROWTH 2 DAYS Performed at [facility], [address] [place], [place] [address]    Report Status PENDING  Incomplete  MRSA Next Gen by PCR, Nasal     Status: None   Collection Time: 12/22/20  9:50 PM   Specimen: Nasal Mucosa; Nasal Swab  Result Value Ref Range Status   MRSA by PCR Next Gen NOT DETECTED NOT DETECTED Final    Comment: (NOTE) The GeneXpert MRSA Assay (FDA approved for NASAL specimens only), is one component of a comprehensive MRSA colonization surveillance program. It is not intended to diagnose MRSA infection nor to guide or monitor treatment for MRSA infections. Test performance is not FDA approved in patients less than 19 years old. Performed at [facility], [address], [place], [place] [address]      Radiology Studies: US Venous Img Lower Bilateral (DVT)  Result Date: 12/23/2020 CLINICAL DATA:  Shortness of breath.  Elevated D-dimer. EXAM: BILATERAL LOWER EXTREMITY VENOUS DOPPLER ULTRASOUND TECHNIQUE: Gray-scale sonography with graded compression, as well as color Doppler and duplex ultrasound were performed to evaluate the lower extremity deep venous systems from the level of the common femoral vein and including the common femoral, femoral, profunda femoral, popliteal and calf veins including the posterior tibial, peroneal and gastrocnemius veins when visible. The superficial great saphenous vein was also interrogated. Spectral Doppler was utilized to evaluate flow at rest and with distal augmentation maneuvers in the common femoral, femoral and popliteal veins. COMPARISON:  Chest radiograph, 12/22/2020. FINDINGS: RIGHT LOWER EXTREMITY Normal compressibility of the RIGHT common femoral, superficial femoral, and  popliteal veins, as well as the visualized calf veins. Visualized portions of profunda femoral vein and great saphenous vein unremarkable. No filling defects to suggest DVT on grayscale or color Doppler imaging. Doppler waveforms show normal direction of venous flow, normal respiratory plasticity and response to augmentation. OTHER No evidence of superficial thrombophlebitis or abnormal fluid collection. LEFT LOWER EXTREMITY Normal compressibility of the LEFT common femoral, superficial femoral, and popliteal veins, as well as the visualized calf veins. Visualized portions of profunda femoral vein and great saphenous vein unremarkable. No filling defects to suggest DVT on grayscale or color Doppler imaging. Doppler waveforms show normal direction of venous flow, normal respiratory plasticity and response to augmentation. OTHER No evidence of superficial thrombophlebitis or abnormal fluid collection. IMPRESSION: No evidence of femoropopliteal DVT within either lower extremity. Michaelle Birks, MD [facility] Radiology Electronically Signed   By: Michaelle Birks M.D.   On: 12/23/2020 14:15   DG CHEST PORT 1 VIEW  Result Date: 12/24/2020 CLINICAL DATA:  70 year old male with history of shortness of breath. EXAM: PORTABLE CHEST 1 VIEW COMPARISON:  Chest x-ray 12/22/2020. FINDINGS: Elevation of the right hemidiaphragm. Lung volumes are low. Linear opacity in the left mid to lower lung, new compared to the prior study, compatible with some subsegmental atelectasis. No consolidative airspace disease. No pleural effusions. No pneumothorax. No pulmonary nodule or mass noted. Pulmonary vasculature and the cardiomediastinal silhouette are within normal limits. IMPRESSION: 1. Low lung volumes without radiographic evidence of acute cardiopulmonary disease. Electronically Signed   By: Vinnie Langton M.D.   On: 12/24/2020 08:02     Scheduled Meds:  amitriptyline  10 mg Oral BID    aspirin EC  81 mg Oral Daily   budesonide (PULMICORT) nebulizer solution  0.5 mg Nebulization BID   Chlorhexidine Gluconate Cloth  6 each Topical Daily   dextromethorphan-guaiFENesin  1 tablet Oral BID   DULoxetine  60 mg Oral Daily   enoxaparin (LOVENOX) injection  40 mg Subcutaneous Q24H   fluticasone  1 spray Each Nare Daily   insulin aspart  0-15 Units Subcutaneous TID WC   insulin aspart  0-5 Units Subcutaneous QHS   insulin detemir  20 Units Subcutaneous QHS   ipratropium-albuterol  3 mL Nebulization QID   lisinopril  20 mg Oral Daily   mouth rinse  15 mL Mouth Rinse BID   methylPREDNISolone (SOLU-MEDROL) injection  40 mg Intravenous Q12H   pantoprazole  40 mg Oral Daily   polyvinyl alcohol  1 drop Both Eyes BID   QUEtiapine  25 mg Oral QHS   riluzole  50 mg Oral Q12H   timolol  1 drop Left Eye BID   Continuous Infusions:  ampicillin-sulbactam (UNASYN) IV 3 g (12/24/20 0827)     LOS:  2 days    Time spent: 35 minutes    Barton Dubois, MD [facility]   To contact the attending provider between 7A-7P or the covering provider during after hours 7P-7A, please log into the web site [email or web address] and access using universal [place] password for that web site. If you do not have the password, please call the hospital operator.  12/24/2020, 12:20 PM

## 2020-12-24 NOTE — Progress Notes (Addendum)
Patient being fed lunch by care tech and writer called in patient room due to patient appeared to be choking. Patient face was red, O2 sat dropping and patient could not speak or cough and when asked if he was choking, patient nodded yes. Heimlich like thrusts and back pats were done along with [place] suctioning that was already set up by writer earlier in the morning was used with success. Patient started coughing and breathing. O2 sat back up to 92% on 4 Liters O2 via nasal cannula. Dr Dyann Kief made aware of incident, new orders placed. Patient wife also made aware when she called which was ok with patient to let her know.

## 2020-12-24 NOTE — Progress Notes (Signed)
Pt on home triology machine for bedtime

## 2020-12-25 ENCOUNTER — Inpatient Hospital Stay (HOSPITAL_COMMUNITY): Payer: No Typology Code available for payment source

## 2020-12-25 LAB — BLOOD GAS, ARTERIAL
Acid-Base Excess: 4 mmol/L — ABNORMAL HIGH (ref 0.0–2.0)
Bicarbonate: 27.4 mmol/L (ref 20.0–28.0)
Drawn by: 39898
FIO2: 36
O2 Saturation: 94 %
Patient temperature: 36.4
pCO2 arterial: 44.9 mmHg (ref 32.0–48.0)
pH, Arterial: 7.415 (ref 7.350–7.450)
pO2, Arterial: 76.7 mmHg — ABNORMAL LOW (ref 83.0–108.0)

## 2020-12-25 LAB — LEGIONELLA PNEUMOPHILA SEROGP 1 UR AG: L. pneumophila Serogp 1 Ur Ag: NEGATIVE

## 2020-12-25 LAB — GLUCOSE, CAPILLARY
Glucose-Capillary: 130 mg/dL — ABNORMAL HIGH (ref 70–99)
Glucose-Capillary: 183 mg/dL — ABNORMAL HIGH (ref 70–99)
Glucose-Capillary: 248 mg/dL — ABNORMAL HIGH (ref 70–99)
Glucose-Capillary: 222 mg/dL — ABNORMAL HIGH (ref 70–99)

## 2020-12-25 MED ORDER — HYDROCOD POLST-CPM POLST ER 10-8 MG/5ML PO SUER
5.0000 mL | Freq: Two times a day (BID) | ORAL | Status: DC | PRN
  Administered 2020-12-25 – 2020-12-26 (×2): 5 mL via ORAL
  Filled 2020-12-25 (×2): qty 5

## 2020-12-25 MED ORDER — IPRATROPIUM-ALBUTEROL 0.5-2.5 (3) MG/3ML IN SOLN
3.0000 mL | Freq: Two times a day (BID) | RESPIRATORY_TRACT | Status: DC
  Administered 2020-12-25 – 2020-12-26 (×2): 3 mL via RESPIRATORY_TRACT
  Filled 2020-12-25 (×3): qty 3

## 2020-12-25 MED ORDER — METHYLPREDNISOLONE SODIUM SUCC 40 MG IJ SOLR
40.0000 mg | Freq: Every day | INTRAMUSCULAR | Status: DC
  Administered 2020-12-26: 40 mg via INTRAVENOUS
  Filled 2020-12-25: qty 1

## 2020-12-25 MED ORDER — HYDRALAZINE HCL 20 MG/ML IJ SOLN
10.0000 mg | Freq: Four times a day (QID) | INTRAMUSCULAR | Status: DC | PRN
  Administered 2020-12-25 – 2020-12-26 (×2): 10 mg via INTRAVENOUS
  Filled 2020-12-25 (×3): qty 1

## 2020-12-25 NOTE — Progress Notes (Signed)
O2 increased from 5lpm to 6lpm cann

## 2020-12-25 NOTE — Progress Notes (Signed)
Writer called in to room by patient wife due to patient choking on lunch food. Patient red in color, sats dropping, unable to speak or breathe. Heimlich maneuver performed after patient confirmed by nodding yes that he was choking. Chunks of food (green beans, noodles and small piece of meat) came flying out of patient mouth. Patient with weak cough, gasping for air. Pats on back and suctioning performed. Dr Dyann Kief and respiratory made aware. Emotional support provided to patient and wife. O2 sats back up to 90-93% on the 4 Liters O2.

## 2020-12-25 NOTE — TOC Initial Note (Signed)
Transition of Care [facility]) - Initial/Assessment Note    Patient Details  Name: Javier Cook MRN: [id number] Date of Birth: 14-Aug-1950  Transition of [facility] CM/SW Contact:    Ihor Gully, LCSW Phone Number: 12/25/2020, 12:15 PM  Clinical Narrative:                 Patient from home with wife. Multiple active medical problems. In need of home oxygen. Goes to [place], sees Jamelle Haring NP. Goes to [facility] for his ALS. Oxygen has to be ordered through [facility]. Oxygen orders sent to [facility] via fax at [phone].  Expected Discharge Plan: Home/Self Care Barriers to Discharge: No Barriers Identified   Patient Goals and CMS Choice Patient states their goals for this hospitalization and ongoing recovery are:: return home      Expected Discharge Plan and Services Expected Discharge Plan: Home/Self Care     Post Acute Care Choice: Durable Medical Equipment Living arrangements for the past 2 months: Single Family Home                 DME Arranged: Oxygen DME Agency: [place] Date DME Agency Contacted: 12/25/20 Time DME Agency Contacted: 1214 Representative spoke with at DME Agency: information faxed to [phone]            Prior Living Arrangements/Services Living arrangements for the past 2 months: [place] Lives with:: Spouse                   Activities of Daily Living Home Assistive Devices/Equipment: Grab bars around toilet, CPAP, CBG Meter, Blood pressure cuff, Eyeglasses, Hospital bed, [employer], Wheelchair, Environmental consultant (specify type) ADL Screening (condition at time of admission) Patient's cognitive ability adequate to safely complete daily activities?: Yes Is the patient deaf or have difficulty hearing?: No Does the patient have difficulty seeing, even when wearing glasses/contacts?: Yes Does the patient have difficulty concentrating, remembering, or making decisions?: No Patient able to express  need for assistance with ADLs?: Yes Does the patient have difficulty dressing or bathing?: Yes Independently performs ADLs?: No Communication: Independent Dressing (OT): Dependent Is this a change from baseline?: Pre-admission baseline Grooming: Dependent Is this a change from baseline?: Pre-admission baseline Feeding: Dependent Is this a change from baseline?: Pre-admission baseline Bathing: Dependent Is this a change from baseline?: Pre-admission baseline Toileting: Dependent Is this a change from baseline?: Pre-admission baseline In/Out Bed: Dependent Is this a change from baseline?: Pre-admission baseline Walks in Home: Dependent Is this a change from baseline?: Pre-admission baseline Does the patient have difficulty walking or climbing stairs?: Yes Weakness of Legs: Both Weakness of Arms/Hands: Both  Permission Sought/Granted Permission sought to share information with : Family Supports    Share Information with NAME: Mrs. Owens Shark           Emotional Assessment       Orientation: : Oriented to Place, Oriented to Situation, Oriented to  Time, Oriented to Self Alcohol / Substance Use: Not Applicable Psych Involvement: No (comment)  Admission diagnosis:  DVT (deep venous thrombosis) (HCC) [I82.409] Acute respiratory failure with hypoxia (HCC) [J96.01] Patient Active Problem List   Diagnosis Date Noted   Acute respiratory failure with hypoxia ([place]) 12/22/2020   Gait abnormality 09/16/2018   Quadriparesis ([place]) 09/16/2018   Family history of colon cancer in mother 11/23/2015   History of colonic polyps 11/23/2015   Chronic diarrhea 11/23/2015   PCP:  Lavone Orn, MD Pharmacy:   [id number] -  [place], [place] - [address] [place] [address] Phone: [phone] Fax: [phone]     Social Determinants of Health (SDOH) Interventions    Readmission Risk Interventions No flowsheet data  found.

## 2020-12-25 NOTE — Progress Notes (Signed)
Pt on home triology machine for bedtime

## 2020-12-25 NOTE — Progress Notes (Signed)
Modified Barium Swallow Progress Note  Patient Details  Name: Javier Cook MRN: [id number] Date of Birth: [birth year]  Today's Date: 12/25/2020  Modified Barium Swallow completed.  Full report located under Chart Review in the Imaging Section.  Brief recommendations include the following:  Clinical Impression   Pt presents with mild/moderate oropharyngeal phase dysphagia with weak lingual manipulation resulting in premature spillage of thin liquids over the base of the tongue into the laryngeal vestibule resulting in trace aspiration during the swallow. This occurred with tsp/cup/straw sips thin, however aspiration was eliminated with Pt implemented small sip with chin tuck and repeat/dry swallow after the primary swallow to clear vallecular and lateral channel residuals. Nectar thick liquids were not penetrated/aspirated with or without the chin tuck. Pt first took a barium tablet with thin liquids, which resulted in aspiration of thins and stasis of the barium tablet in the valleculae. This was cleared with puree wash. He was then presented with a barium tablet in puree and he swallowed without incident. Regular textures resulted in the tail end of the bolus remaining near the PE segment along the posterior pharyngeal wall- which cleared with a dry swallow. Pt had a choking episode earlier today, which required the Heimlich maneuver for removal of green beans and thick noodles. Recommend D2 and ok for thin liquids with implementation of a chin tuck OR nectar-thick liquids pending Pt preference and how he does clinically over the course of a meal. Pt was able to implement the chin tuck independently on 100% of trials after shown. This study and the recommendations were reviewed with Pt and spouse. They were given written information regarding recommendations and plan for f/u SLP services at the [place] clinic or via [facility] through the [place]. SLP will follow during acute stay.     Swallow Evaluation  Recommendations       SLP Diet Recommendations: Dysphagia 2 (Fine chop) solids;Nectar thick liquid;Thin liquid   Liquid Administration via: Straw;Cup (chin tuck)   Medication Administration: Whole meds with puree   Supervision: Full assist for feeding;Staff to assist with self feeding   Compensations: Slow rate;Small sips/bites;Multiple dry swallows after each bite/sip;Use straw to facilitate chin tuck;Chin tuck   Postural Changes: Remain semi-upright after after feeds/meals (Comment);Seated upright at 90 degrees   Oral Care Recommendations: Oral care BID;Staff/trained caregiver to provide oral care   Other Recommendations: Order thickener from pharmacy;Clarify dietary restrictions   Thank you,  Genene Churn, [place]  [place] 12/25/2020,4:52 PM

## 2020-12-25 NOTE — Progress Notes (Signed)
[facility]. Advised that patient's oxygen will have to be forwarded to the ALS clinical and that respiratory would have to review it due to his ALS diagnosis. Advised that oxygen will be delivered tomorrow, 12/26/20.   Khilynn Borntreger, Clydene Pugh, LCSW

## 2020-12-25 NOTE — Progress Notes (Signed)
PROGRESS NOTE    Javier Cook  XMI:[id number] DOB: [birth year] DOA: 12/22/2020 PCP: Lavone Orn, MD    Chief Complaint  Patient presents with   Shortness of Breath    Brief Narrative:  Javier Cook is a 70 y.o. male with medical history significant of ALS (follow by neurology service at [facility]), gastroesophageal flux disease, depression/anxiety, type 2 diabetes, glaucoma and hyperlipidemia; who presented to the hospital secondary to worsening in his breathing, hypoxia, low-grade temperature coughing spells and congestion.  Patient and wife reported that he was vaccinated against flu on Wednesday (12/20/2020), and onset Thursday started developing URI symptoms that had rapidly progressed to the point of no receiving relief by the use of his trilogy machine at home.  Due to his underlying history of ALS patient not really able to properly expectorate and ended developing increased work of breathing.  EMS was contacted and on their arrival patient was found to be hypoxic into the mid 70s on room air; he was placed on nonrebreather with improvement in his saturation and then able to been titrated down to 4 L nasal cannula with O2 sats in the mid 90s.    No vaccination records again copy appreciated on his chart; COVID PCR and influenza negative while in the ED.   ED Course: Patient was tachypneic, tachycardic and hypoxic requiring oxygen supplementation.  Increased work of breathing and difficulty speaking in full sentences appreciated on exam.  Chest x-ray has failed to demonstrate any infiltrates or active cardiopulmonary process.  Given patient history and risk for decompensation while presenting SIRS Criteria, [facility] has been contacted to place in the hospital for further evaluation and management.  Cultures taken and empiric antibiotic started with presumption of aspiration pneumonia.    Assessment & Plan: 1-acute respiratory failure with hypoxia in the setting of presumed aspiration  pneumonia -Good oxygen saturation while using 4-5 L nasal cannula supplementation. -patient is afebrile and with the rest of his vital signs within normal limits. -Continue to have episodes of shortness of breath and intermittent coughing spells. -Complete treatment with oral Augmentin. -continue nebulizer management and steroids; starting tapering process. -Repeat chest x-ray demonstrating no acute infiltrates. -Patient had acute risk for decompensation given underlying history of ALS.  Making him an overall poor prognosis long-term. -Continue as needed use of trilogy/BiPAP at bedtime. -Continue the use of flutter valve, chest vest for physiotherapy and also initiating treatment with Tussionex to assist with coughing spells (currently nonproductive). -Strep pneumo antigen negative -Legionella antigen negative.  2-dysphagia/choking episodes -Speech therapy evaluation appreciated; recommendation for dysphagia 2 with thin liquids provided -Patient needs to be sitting in an upright position using bedside chair during meals.  3-gastroesophageal reflux disease/GI prophylaxis -Continue PPI.  4-Type 2 diabetes -Continue sliding scale insulin and the use of Levemir -A1c 5.3 -Current fluctuation in his blood sugar most likely trigger by steroids usage  5-history of ALS -Continue outpatient follow-up with [facility] neurology service -Continue the use of Rilutek -Continue trilogy/BiPAP as needed and nightly as discussed above. -Overall prognosis remains guarded.  6-depression/anxiety -Stable mood -No hallucinations or agitation -Continue Seroquel, Elavil, Cymbalta and as needed Xanax.  7-hypertension -Continue the use of lisinopril -In the setting of his steroid usage blood pressure has been elevated; as needed hydralazine has been added.  8-hyperlipidemia -No longer using a statins as this has been seen on the studies that could potentially worsen ALS. -Discussed heart healthy  diet.  9-history of glaucoma -Continue the use of Timolol as  previously prescribed.  10-Ethics/social/CODE STATUS -Long discussion with patient and wife at bedside; they would like to rescind DNR status at this time. -They understand that pursuing intubation very likely can lead to the requirement of a tracheostomy given his underlying history of ALS. -Based on ABG evaluation and current overall respiratory stability no need to intubate. -Plan is for patient to go home at discharge with home health services. -Oxygen will be delivered through the [place] system on 12/26/2020  DVT prophylaxis: Lovenox Code Status: Full code Family Communication: Patient's wife at bedside. Disposition:   Status is: Inpatient   Consultants:  None  Procedures:  -See below for x-ray reports -Lower extremity Dopplers negative for DVT.  Antimicrobials:  Unasyn; transition to oral Augmentin on 12/24/2020.   Subjective: Stable oxygen saturation on 4-5 L nasal cannula supplementation.  2 episodes of choking with subsequent respiratory distress, shortness of breath, tachypnea and transient desaturation.  No fever, no nausea or vomiting.  One of the episodes was related with food the other one no associated with food intake.  Objective: Vitals:   12/25/20 1127 12/25/20 1150 12/25/20 1214 12/25/20 1608  BP:  (!) 176/95    Pulse: 76 79 80 79  Resp: (!) 29  (!) 32 (!) 30  Temp: (!) 97.5 F (36.4 C)   98.2 F (36.8 C)  TempSrc: Axillary   Oral  SpO2: 90% 91% 93% 93%  Weight:      Height:        Intake/Output Summary (Last 24 hours) at 12/25/2020 1807 Last data filed at 12/25/2020 0437 Gross per 24 hour  Intake 240 ml  Output 1100 ml  Net -860 ml   Filed Weights   12/22/20 0935  Weight: 73.9 kg    Examination: General exam: Alert, awake, oriented x 3; continue experiencing intermittent episodes of shocking events (while eating and without food), with subsequent development of shortness of  breath along with respiratory distress and tachypnea. Respiratory system: Positive scattered rhonchi; weak coughing spells and decreased respiratory effort. Cardiovascular system:RRR. No murmurs, rubs, gallops.  No JVD. Gastrointestinal system: Abdomen is nondistended, soft and nontender. No organomegaly or masses felt. Normal bowel sounds heard. Central nervous system: Alert and oriented. No new focal neurological deficits. Extremities: No cyanosis, clubbing or edema. Skin: No rashes, lesions or ulcers Psychiatry: Judgement and insight appear normal. Mood & affect appropriate.    Data Reviewed: I have personally reviewed following labs and imaging studies  CBC: Recent Labs  Lab 12/22/20 0950 12/23/20 0604 12/24/20 0543  WBC 14.1* 14.8* 14.4*  NEUTROABS 12.7*  --   --   HGB 15.5 11.8* 12.7*  HCT 46.7 34.1* 38.2*  MCV 88.1 86.1 85.5  PLT 203 179 470    Basic Metabolic Panel: Recent Labs  Lab 12/22/20 0950 12/23/20 0604 12/24/20 0543  NA 138 135 135  K 4.2 3.7 3.9  CL 99 103 103  CO2 30 26 25   GLUCOSE 152* 144* 191*  BUN 9 10 15   CREATININE 0.55* 0.51* 0.41*  CALCIUM 8.9 8.0* 8.4*  MG  --  1.6* 2.0  PHOS  --  2.3* 2.7    GFR: Estimated Creatinine Clearance: 78 mL/min (A) (by C-G formula based on SCr of 0.41 mg/dL (L)).  Liver Function Tests: Recent Labs  Lab 12/22/20 0950  AST 22  ALT 20  ALKPHOS 69  BILITOT 1.1  PROT 7.3  ALBUMIN 4.0    CBG: Recent Labs  Lab 12/24/20 1618 12/24/20 2122 12/25/20 0720 12/25/20 1129  12/25/20 1607  GLUCAP 134* 216* 130* 183* 248*     Recent Results (from the past 240 hour(s))  Resp Panel by RT-PCR (Flu A&B, Covid) Nasopharyngeal Swab     Status: None   Collection Time: 12/22/20 10:19 AM   Specimen: Nasopharyngeal Swab; Nasopharyngeal(NP) swabs in vial transport medium  Result Value Ref Range Status   SARS Coronavirus 2 by RT PCR NEGATIVE NEGATIVE Final    Comment: (NOTE) SARS-CoV-2 target nucleic acids are NOT  DETECTED.  The SARS-CoV-2 RNA is generally detectable in upper respiratory specimens during the acute phase of infection. The lowest concentration of SARS-CoV-2 viral copies this assay can detect is 138 copies/mL. A negative result does not preclude SARS-Cov-2 infection and should not be used as the sole basis for treatment or other patient management decisions. A negative result may occur with  improper specimen collection/handling, submission of specimen other than nasopharyngeal swab, presence of viral mutation(s) within the areas targeted by this assay, and inadequate number of viral copies(<138 copies/mL). A negative result must be combined with clinical observations, patient history, and epidemiological information. The expected result is Negative.  Fact Sheet for Patients:  EntrepreneurPulse.com.au  Fact Sheet for Healthcare Providers:  IncredibleEmployment.be  This test is no t yet approved or cleared by the [place] FDA and  has been authorized for detection and/or diagnosis of SARS-CoV-2 by FDA under an Emergency Use Authorization (EUA). This EUA will remain  in effect (meaning this test can be used) for the duration of the COVID-19 declaration under Section 564(b)(1) of the Act, 21 U.S.C.section 360bbb-3(b)(1), unless the authorization is terminated  or revoked sooner.       Influenza A by PCR NEGATIVE NEGATIVE Final   Influenza B by PCR NEGATIVE NEGATIVE Final    Comment: (NOTE) The Xpert Xpress SARS-CoV-2/FLU/RSV plus assay is intended as an aid in the diagnosis of influenza from Nasopharyngeal swab specimens and should not be used as a sole basis for treatment. Nasal washings and aspirates are unacceptable for Xpert Xpress SARS-CoV-2/FLU/RSV testing.  Fact Sheet for Patients: EntrepreneurPulse.com.au  Fact Sheet for Healthcare Providers: IncredibleEmployment.be  This test is not yet  approved or cleared by the [place] FDA and has been authorized for detection and/or diagnosis of SARS-CoV-2 by FDA under an Emergency Use Authorization (EUA). This EUA will remain in effect (meaning this test can be used) for the duration of the COVID-19 declaration under Section 564(b)(1) of the Act, 21 U.S.C. section 360bbb-3(b)(1), unless the authorization is terminated or revoked.  Performed at [facility], [address], [place], [place] [address]   Blood Culture (routine x 2)     Status: None (Preliminary result)   Collection Time: 12/22/20 10:36 AM   Specimen: BLOOD LEFT ARM  Result Value Ref Range Status   Specimen Description BLOOD LEFT ARM  Final   Special Requests   Final    Immunocompromised BOTTLES DRAWN AEROBIC AND ANAEROBIC Blood Culture adequate volume   Culture   Final    NO GROWTH 3 DAYS Performed at [facility], [address] [place], [place] [address]    Report Status PENDING  Incomplete  Blood Culture (routine x 2)     Status: None (Preliminary result)   Collection Time: 12/22/20 10:41 AM   Specimen: BLOOD LEFT ARM  Result Value Ref Range Status   Specimen Description BLOOD LEFT ARM  Final   Special Requests   Final    Immunocompromised BOTTLES DRAWN AEROBIC AND ANAEROBIC Blood Culture adequate volume  Culture   Final    NO GROWTH 3 DAYS Performed at [facility], [address] [place], [place] [address]    Report Status PENDING  Incomplete  MRSA Next Gen by PCR, Nasal     Status: None   Collection Time: 12/22/20  9:50 PM   Specimen: Nasal Mucosa; Nasal Swab  Result Value Ref Range Status   MRSA by PCR Next Gen NOT DETECTED NOT DETECTED Final    Comment: (NOTE) The GeneXpert MRSA Assay (FDA approved for NASAL specimens only), is one component of a comprehensive MRSA colonization surveillance program. It is not intended to diagnose MRSA infection nor to guide or monitor treatment for MRSA infections. Test performance is not FDA approved in  patients less than 2 years old. Performed at [facility], [address] [place], [place] [address]      Radiology Studies: DG CHEST PORT 1 VIEW  Result Date: 12/24/2020 CLINICAL DATA:  70 year old male with history of shortness of breath. EXAM: PORTABLE CHEST 1 VIEW COMPARISON:  Chest x-ray 12/22/2020. FINDINGS: Elevation of the right hemidiaphragm. Lung volumes are low. Linear opacity in the left mid to lower lung, new compared to the prior study, compatible with some subsegmental atelectasis. No consolidative airspace disease. No pleural effusions. No pneumothorax. No pulmonary nodule or mass noted. Pulmonary vasculature and the cardiomediastinal silhouette are within normal limits. IMPRESSION: 1. Low lung volumes without radiographic evidence of acute cardiopulmonary disease. Electronically Signed   By: Vinnie Langton M.D.   On: 12/24/2020 08:02     Scheduled Meds:  amitriptyline  10 mg Oral BID   amoxicillin-clavulanate  1 tablet Oral Q12H   aspirin EC  81 mg Oral Daily   budesonide (PULMICORT) nebulizer solution  0.5 mg Nebulization BID   Chlorhexidine Gluconate Cloth  6 each Topical Daily   dextromethorphan-guaiFENesin  1 tablet Oral BID   DULoxetine  60 mg Oral Daily   enoxaparin (LOVENOX) injection  40 mg Subcutaneous Q24H   fluticasone  1 spray Each Nare Daily   insulin aspart  0-15 Units Subcutaneous TID WC   insulin aspart  0-5 Units Subcutaneous QHS   insulin detemir  20 Units Subcutaneous QHS   [START ON 12/26/2020] ipratropium-albuterol  3 mL Nebulization BID   lisinopril  20 mg Oral Daily   mouth rinse  15 mL Mouth Rinse BID   [START ON 12/26/2020] methylPREDNISolone (SOLU-MEDROL) injection  40 mg Intravenous Daily   pantoprazole  40 mg Oral Daily   polyvinyl alcohol  1 drop Both Eyes BID   QUEtiapine  25 mg Oral QHS   riluzole  50 mg Oral Q12H   timolol  1 drop Left Eye BID   Continuous Infusions:     LOS: 3 days    Time spent: 35 minutes    Javier Dubois, MD [facility]   To contact the attending provider between 7A-7P or the covering provider during after hours 7P-7A, please log into the web site [email or web address] and access using universal [place] password for that web site. If you do not have the password, please call the hospital operator.  12/25/2020, 6:07 PM

## 2020-12-25 NOTE — Progress Notes (Signed)
SATURATION QUALIFICATIONS: (This note is used to comply with regulatory documentation for home oxygen)  Patient Saturations on Room Air at Rest = 86%  Patient Saturations on 4 Liters of oxygen at rest = 91%  Please briefly explain why patient needs home oxygen: Desats on room air

## 2020-12-25 NOTE — Evaluation (Signed)
Clinical/Bedside Swallow Evaluation Patient Details  Name: Javier Cook MRN: [id number] Date of Birth: 1950-10-11  Today's Date: 12/25/2020 Time: SLP Start Time (ACUTE ONLY): 1400 SLP Stop Time (ACUTE ONLY): 1423 SLP Time Calculation (min) (ACUTE ONLY): 23 min  Past Medical History:  Past Medical History:  Diagnosis Date   ALS (amyotrophic lateral sclerosis) ([place])    Anxiety    ARDS (adult respiratory distress syndrome) ([place]) 2003   Arthritis    Cancer ([place])    basal cell of skin   Clostridium difficile infection 2003   Depression    Diabetes ([place])    Gait abnormality 09/16/2018   GERD (gastroesophageal reflux disease)    Glaucoma    H/O agent Orange exposure    History of ARDS 2007   Hypercholesteremia    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 patient developed ARDS/sepsis.   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 male with medical history  significant of ALS (follow by neurology service at [facility]), gastroesophageal flux disease, depression/anxiety, type 2 diabetes, glaucoma and hyperlipidemia; who presented to the hospital secondary to worsening in his breathing, hypoxia, low-grade temperature coughing spells and congestion.  Patient and wife reported that he was vaccinated against flu on Wednesday (12/20/2020), and onset Thursday started developing URI symptoms that had rapidly progressed to the point of no receiving relief by the use of his trilogy machine at home.  Due to his underlying history of ALS patient not really able to properly expectorate and ended developing increased work of breathing.  EMS was contacted and on their arrival patient was found to be hypoxic into the mid 70s on room air; he was placed on nonrebreather with improvement in his saturation and then able to been titrated down to 4 L nasal cannula with O2 sats in the mid 90s. Patient is afebrile, still complaining of coughing spells and SOB, even improved. Choking episode while eating appreciated. BSE ordered.    Assessment / Plan / Recommendation  Clinical Impression  Clinical swallow evaluation completed at bedside with nursing and wife present. Wife indicates that Pt has not had difficulty swallowing in the recent past at home, however he has had two "choking" episodes on solid foods since he has been admitted. Oral motor examination reveals no gross facial  asymmetry and adequate movement. Pt with reduced breath support and presents with a continuous dry cough before, during, and after po. Plan for MBSS later today with recommendations to follow. SLP Visit Diagnosis: Dysphagia, unspecified (R13.10)    Aspiration Risk  Moderate aspiration risk    Diet Recommendation  (MBSS pending)   Medication Administration: Whole meds with puree Postural Changes: Seated upright at 90 degrees;Remain upright for at least 30 minutes after po intake    Other  Recommendations  Oral Care Recommendations: Oral care BID;Staff/trained caregiver to provide oral care    Recommendations for follow up therapy are one component of a multi-disciplinary discharge planning process, led by the attending physician.  Recommendations may be updated based on patient status, additional functional criteria and insurance authorization.  Follow up Recommendations        Assistance Recommended at Discharge Frequent or constant Supervision/Assistance  Functional Status Assessment Patient has had a recent decline in their functional status and/or demonstrates limited ability to make significant improvements in function in a reasonable and predictable amount of time  Frequency and Duration            Prognosis Prognosis for Safe Diet Advancement: Fair Barriers to Reach Goals: Severity of deficits      Swallow Study   General Date of Onset: 12/22/20 HPI: Javier Cook is a 70 y.o. male with medical history significant of ALS (follow by neurology service at [facility]), gastroesophageal flux disease, depression/anxiety, type 2 diabetes, glaucoma and hyperlipidemia; who presented to the hospital secondary to worsening in his breathing, hypoxia, low-grade temperature coughing spells and congestion.  Patient and wife reported that he was vaccinated against flu on Wednesday (12/20/2020), and onset Thursday started developing URI symptoms that had rapidly progressed to the point of no receiving relief by the use of his trilogy machine at home.  Due to his underlying history of ALS patient not really able to properly expectorate and ended developing increased work of breathing.  EMS was contacted and on their arrival patient was found to be hypoxic into the mid 70s on room air; he was placed on nonrebreather with improvement in his saturation and then able to been titrated down to 4 L nasal cannula with O2 sats in the mid 90s. Patient is afebrile, still complaining of coughing spells and SOB,  even improved. Choking episode while eating appreciated. BSE ordered. Type of Study: Bedside Swallow Evaluation Previous Swallow Assessment: N/A Diet Prior to this Study: Dysphagia 3 (soft);Thin liquids Temperature Spikes Noted: No Respiratory Status: Nasal cannula History of Recent Intubation: No Behavior/Cognition: Alert;Cooperative;Pleasant mood Oral Cavity Assessment: Within Functional Limits Oral Care Completed by SLP: Recent completion by staff Oral Cavity - Dentition: Adequate natural dentition Vision: Functional for self-feeding Self-Feeding Abilities: Total assist Patient Positioning: Upright in bed Baseline Vocal Quality: Normal Volitional Cough: Weak Volitional Swallow: Able to elicit    Oral/Motor/Sensory Function Overall Oral Motor/Sensory Function: Within functional limits   Ice Chips Ice chips: Within functional limits Presentation: Spoon Other Comments: repetitive, continuous coughing before, during, after po   Thin Liquid Thin Liquid: Within functional limits Presentation: Cup;Straw;Spoon    Nectar Thick Nectar Thick Liquid: Not tested   Honey Thick Honey Thick Liquid: Not tested   Puree Puree: Within functional limits Presentation: Spoon   Solid     Solid: Not tested Other Comments: Pt essentially required the heimleich earlier today by RN with solids     Thank you,  Javier Cook, [place]  Javier Cook 12/25/2020,2:31 PM

## 2020-12-26 ENCOUNTER — Encounter (HOSPITAL_COMMUNITY): Payer: Self-pay | Admitting: Internal Medicine

## 2020-12-26 DIAGNOSIS — R0602 Shortness of breath: Secondary | ICD-10-CM

## 2020-12-26 DIAGNOSIS — K219 Gastro-esophageal reflux disease without esophagitis: Secondary | ICD-10-CM

## 2020-12-26 DIAGNOSIS — I10 Essential (primary) hypertension: Secondary | ICD-10-CM

## 2020-12-26 DIAGNOSIS — R1314 Dysphagia, pharyngoesophageal phase: Secondary | ICD-10-CM

## 2020-12-26 DIAGNOSIS — G1221 Amyotrophic lateral sclerosis: Secondary | ICD-10-CM

## 2020-12-26 LAB — GLUCOSE, CAPILLARY
Glucose-Capillary: 120 mg/dL — ABNORMAL HIGH (ref 70–99)
Glucose-Capillary: 188 mg/dL — ABNORMAL HIGH (ref 70–99)
Glucose-Capillary: 188 mg/dL — ABNORMAL HIGH (ref 70–99)

## 2020-12-26 MED ORDER — ACETAMINOPHEN 325 MG PO TABS: 650.0000 mg | ORAL_TABLET | Freq: Four times a day (QID) | ORAL | 0 refills | Status: AC | PRN

## 2020-12-26 MED ORDER — HYDROCOD POLST-CPM POLST ER 10-8 MG/5ML PO SUER: 5.0000 mL | Freq: Two times a day (BID) | ORAL | 0 refills | Status: AC | PRN

## 2020-12-26 MED ORDER — OMEPRAZOLE 20 MG PO CPDR: 20.0000 mg | DELAYED_RELEASE_CAPSULE | Freq: Two times a day (BID) | ORAL | 2 refills | Status: AC

## 2020-12-26 MED ORDER — LOSARTAN POTASSIUM 50 MG PO TABS: 50.0000 mg | ORAL_TABLET | Freq: Every day | ORAL | 3 refills | Status: AC

## 2020-12-26 MED ORDER — AMOXICILLIN-POT CLAVULANATE 875-125 MG PO TABS
1.0000 | ORAL_TABLET | Freq: Two times a day (BID) | ORAL | 0 refills | Status: AC
Start: 2020-12-26 — End: 2020-12-30

## 2020-12-26 MED ORDER — PREDNISONE 20 MG PO TABS: ORAL_TABLET | ORAL | 0 refills | Status: AC

## 2020-12-26 MED ORDER — FLUTICASONE PROPIONATE 50 MCG/ACT NA SUSP: 2.0000 | Freq: Every day | NASAL | 1 refills | Status: AC

## 2020-12-26 NOTE — Plan of Care (Signed)
  Problem: Acute Rehab PT Goals(only PT should resolve) Goal: Pt Will Go Supine/Side To Sit Outcome: Progressing Flowsheets (Taken 12/26/2020 1351) Pt will go Supine/Side to Sit: with moderate assist Goal: Pt Will Go Sit To Supine/Side Outcome: Progressing Flowsheets (Taken 12/26/2020 1351) Pt will go Sit to Supine/Side: with moderate assist Goal: Patient Will Transfer Sit To/From Stand Outcome: Progressing Flowsheets (Taken 12/26/2020 1351) Patient will transfer sit to/from stand: with moderate assist Goal: Pt Will Transfer Bed To Chair/Chair To Bed Outcome: Progressing Flowsheets (Taken 12/26/2020 1351) Pt will Transfer Bed to Chair/Chair to Bed: with mod assist Goal: Pt Will Ambulate Outcome: Progressing Flowsheets (Taken 12/26/2020 1351) Pt will Ambulate:  25 feet  with minimal assist  with rolling walker   Tori Kendryck Lacroix PT, DPT 12/26/20, 1:51 PM

## 2020-12-26 NOTE — Progress Notes (Signed)
Speech Language Pathology Treatment: Dysphagia  Patient Details Name: Javier Cook MRN: [id number] DOB: 1950-05-05 Today's Date: 12/26/2020 Time: 3299-2426 SLP Time Calculation (min) (ACUTE ONLY): 9 min  Assessment / Plan / Recommendation Clinical Impression  SLP stopped by room to see Pt and spouse prior to d/c. Wife was feeding Pt and Pt somewhat reclined/sliding to the left in the bed, so SLP and RN repositioned Pt upright. Pt is difficult to position in the bed due to poor trunk support and he stated that he became light headed when he went to transfer to his chair. He has been drinking thin and NTL liquids via straw sip and with a chin tuck. He demonstrated the sequence for SLP. He reports occasional mild globus sensation in posterior oral cavity, but no coughing or choking episodes as evidenced yesterday. Pt to receive [facility] SLP services after d/c from acute and has plans to f/u at the [place] clinic in December.    HPI HPI: Javier Cook is a 70 y.o. 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  Patient and wife reported that he was vaccinated against flu on Wednesday (12/20/2020), and onset Thursday started developing URI symptoms that had rapidly progressed to the point of no receiving relief by the use of his trilogy machine at home.  Due to his underlying history of ALS patient not really able to properly expectorate and ended developing increased work of breathing.  EMS was contacted and on their arrival patient was found to be hypoxic into the mid 70s on room air; he was placed on nonrebreather with improvement in his saturation and then able to been titrated down to 4 L nasal cannula with O2 sats in the mid 90s. Patient  is afebrile, still complaining of coughing spells and SOB, even improved. Choking episode while eating appreciated. BSE ordered.      SLP Plan  Discharge SLP treatment due to (comment) (Pt leaving to go home today and will receive [facility] SLP)      Recommendations for follow up therapy are one component of a multi-disciplinary discharge planning process, led by the attending physician.  Recommendations may be updated based on patient status, additional functional criteria and insurance authorization.    Recommendations  Diet recommendations: Dysphagia 2 (fine chop);Nectar-thick liquid (ok for thin with chin tuck) Liquids provided via: Straw Medication Administration: Whole meds with puree Supervision: Staff to assist with self feeding;Full supervision/cueing for compensatory strategies Compensations: Slow rate;Small sips/bites;Multiple dry swallows after each bite/sip;Use straw to facilitate chin tuck;Chin tuck Postural Changes and/or Swallow Maneuvers: Out of bed for meals;Seated upright 90 degrees;Upright 30-60 min after meal                Oral Care Recommendations: Oral care BID;Staff/trained caregiver to provide oral care Follow Up Recommendations: Home health SLP Assistance recommended at discharge: Frequent or constant Supervision/Assistance SLP Visit Diagnosis: Dysphagia, oropharyngeal phase (R13.12) Plan: Discharge SLP treatment due to (comment) (Pt leaving to go home today and will receive [facility] SLP)       Thank you,  Genene Churn, [place]                 [place]  12/26/2020, 3:37 PM

## 2020-12-26 NOTE — Evaluation (Signed)
Physical Therapy Evaluation Patient Details Name: Javier Cook MRN: [id number] DOB: 1951/02/09 Today's Date: 12/26/2020  History of Present Illness  Javier Cook is a 70 y.o. male who presents with worsening breathing, hypoxia, low-grade temperature, coughing spells and congestion.  PMH: ALS, diabetes, depression, anxiety, glaucoma, hyperlipidemia, PTSD   Clinical Impression  Pt admitted with above diagnosis. Pt from home with spouse and nurse aide assisting at home, able to ambulate limited distances with rollator in the home, not on home O2 24/7 at baseline. Pt currently requiring significant assistance to sit EOB and min A to maintain sitting for limited duration. Pt reports anxiousness regarding inability to breath and frustrated with constant supplemental O2 need, required multiple relaxation techniques to soothe and encourage pt as well as RN in room to administer anxiety medication. Pt and spouse motivated to return home, do agree pt is below baseline due to increased assist with bed mobility and inability to stand or take steps due to "light" complaints. Pt currently with functional limitations due to the deficits listed below (see PT Problem List). Pt will benefit from skilled PT to increase their independence and safety with mobility to allow discharge to the venue listed below.          Recommendations for follow up therapy are one component of a multi-disciplinary discharge planning process, led by the attending physician.  Recommendations may be updated based on patient status, additional functional criteria and insurance authorization.  Follow Up Recommendations Home health PT    Assistance Recommended at Discharge Frequent or constant Supervision/Assistance  Functional Status Assessment Patient has had a recent decline in their functional status and demonstrates the ability to make significant improvements in function in a reasonable and predictable amount of time.   Equipment Recommendations  None recommended by PT    Recommendations for Other Services       Precautions / Restrictions Precautions Precautions: Fall Precaution Comments: new O2 requirement Restrictions Weight Bearing Restrictions: No      Mobility  Bed Mobility Overal bed mobility: Needs Assistance Bed Mobility: Supine to Sit;Sit to Supine  Supine to sit: Max assist Sit to supine: Mod assist;+2 for physical assistance  General bed mobility comments: max A to upright trunk into sitting EOB and assist BLE over to EOB, limited sitting tolerance and poor sitting balance requiring UE support and min A to maintain static sitting; mod A +2 to lift BLE and position trunk back into supine; total A to scoot up in bed using bed pad    Transfers   General transfer comment: unable, pt reports feeling "light" once sitting EOB and requests to return to sitting    Ambulation/Gait  General Gait Details: unable, pt reports feeling "light" once sitting EOB and requests to return to sitting  Stairs            Wheelchair Mobility    Modified Rankin (Stroke Patients Only)       Balance Overall balance assessment: Needs assistance Sitting-balance support: Feet supported Sitting balance-Leahy Scale: Poor Sitting balance - Comments: seated EOB       Pertinent Vitals/Pain Pain Assessment: No/denies pain    Home Living Family/patient expects to be discharged to:: Private residence Living Arrangements: Spouse/significant other Available Help at Discharge: Family;Personal care attendant;Available 24 hours/day Type of Home: House  Home Equipment: Rollator (4 wheels);Hospital bed Additional Comments: pt reports nurse aide that assists at home    Prior Function Prior Level of Function : Needs assist  Physical Assist :  Mobility (physical);ADLs (physical) Mobility (physical): Bed mobility;Transfers;Gait ADLs (physical): Feeding;Grooming;Bathing;Dressing;Toileting;IADLs Mobility  Comments: spouse reports heavy assist to come to sitting EOB, pt transfers with rollator and able to ambualte household distances ADLs Comments: spouse reports assists as needed with ADLs     Hand Dominance        Extremity/Trunk Assessment   Upper Extremity Assessment Upper Extremity Assessment: Generalized weakness    Lower Extremity Assessment Lower Extremity Assessment: RLE deficits/detail;LLE deficits/detail RLE Deficits / Details: 2-/5 ankle DF/PF and knee flexion, full passive knee extension, hip 1/5 abd/add LLE Deficits / Details: 2-/5 ankle DF/PF and knee flexion, full passive knee extension, hip 1/5 abd/add       Communication   Communication: No difficulties  Cognition Arousal/Alertness: Awake/alert Behavior During Therapy: Anxious Overall Cognitive Status: Within Functional Limits for tasks assessed  General Comments: pt a&o, reports anxiousness about mobilizing for the first time and difficulty breathing in slumped down position in bed, also perseverating on new O2 requirement with multiple questions- offered education and encouragement as able, RN in room to provide anxiety medication        General Comments General comments (skin integrity, edema, etc.): Pt on 4L O2 with SpO2 89-92%    Exercises     Assessment/Plan    PT Assessment Patient needs continued PT services  PT Problem List Decreased strength;Decreased activity tolerance;Decreased balance;Decreased mobility;Decreased coordination;Cardiopulmonary status limiting activity       PT Treatment Interventions DME instruction;Gait training;Functional mobility training;Therapeutic activities;Therapeutic exercise;Balance training;Patient/family education    PT Goals (Current goals can be found in the Care Plan section)  Acute Rehab PT Goals Patient Stated Goal: agreeable to PT to regain strength PT Goal Formulation: With patient/family Time For Goal Achievement: 01/09/21 Potential to Achieve Goals:  Fair    Frequency Min 3X/week   Barriers to discharge        Co-evaluation               AM-PAC PT "6 Clicks" Mobility  Outcome Measure Help needed turning from your back to your side while in a flat bed without using bedrails?: Total Help needed moving from lying on your back to sitting on the side of a flat bed without using bedrails?: Total Help needed moving to and from a bed to a chair (including a wheelchair)?: Total Help needed standing up from a chair using your arms (e.g., wheelchair or bedside chair)?: Total Help needed to walk in hospital room?: Total Help needed climbing 3-5 steps with a railing? : Total 6 Click Score: 6    End of Session Equipment Utilized During Treatment: Oxygen Activity Tolerance: Patient limited by fatigue;Other (comment) (limited by anxiousness) Patient left: in bed;with call bell/phone within reach;with family/visitor present Nurse Communication: Mobility status;Other (comment) (RN administered anxiety medication) PT Visit Diagnosis: Other abnormalities of gait and mobility (R26.89);Muscle weakness (generalized) (M62.81);Other symptoms and signs involving the nervous system (T55.732)    Time: 2025-4270 PT Time Calculation (min) (ACUTE ONLY): 39 min   Charges:   PT Evaluation $PT Eval Moderate Complexity: 1 Mod PT Treatments $Therapeutic Activity: 8-22 mins        Tori Itamar Mcgowan PT, DPT 12/26/20, 1:46 PM

## 2020-12-26 NOTE — Discharge Summary (Signed)
Physician Discharge Summary  Javier Cook ZJI:[id number] DOB: August 11, 1950 DOA: 12/22/2020  PCP: Lavone Orn, MD  Admit date: 12/22/2020 Discharge date: 12/26/2020  Time spent: 35 minutes  Recommendations for Outpatient Follow-up:  Repeat basic metabolic panel to follow to lites and renal function Reassess patient need for oxygen supplementation and further wean off as tolerated. Reassess blood pressure and further adjust antihypertensive regimen. Revisit discussion about goals of care and advance care planning.  Discharge Diagnoses:  Aspiration pneumonia/pneumonitis  Acute respiratory failure with hypoxia (HCC) SOB (shortness of breath) Primary hypertension ALS (amyotrophic lateral sclerosis) (HCC) Pharyngoesophageal dysphagia Gastroesophageal reflux disease   Discharge Condition: Stable and improved.  Discharged home with instruction to follow-up with PCP and with his neurology/ALS clinic after discharge.  CODE STATUS: Full code  Diet recommendation: Dysphagia 2 diet with nectar thick liquids, and well following chin tuck maneuver okay to use thin liquids after oral care.  Filed Weights   12/22/20 0935  Weight: 73.9 kg    History of present illness:  Javier Cook is a 70 y.o. male with medical history significant of ALS (follow by neurology service at [facility]), gastroesophageal flux disease, depression/anxiety, type 2 diabetes, glaucoma and hyperlipidemia; who presented to the hospital secondary to worsening in his breathing, hypoxia, low-grade temperature coughing spells and congestion.  Patient and wife reported that he was vaccinated against flu on Wednesday (12/20/2020), and onset Thursday started developing URI symptoms that had rapidly progressed to the point of no receiving relief by the use of his trilogy machine at home.  Due to his underlying history of ALS patient not really able to properly expectorate and ended developing increased work of breathing.  EMS  was contacted and on their arrival patient was found to be hypoxic into the mid 70s on room air; he was placed on nonrebreather with improvement in his saturation and then able to been titrated down to 4 L nasal cannula with O2 sats in the mid 90s.    No vaccination records again copy appreciated on his chart; COVID PCR and influenza negative while in the ED.   ED Course: Patient was tachypneic, tachycardic and hypoxic requiring oxygen supplementation.  Increased work of breathing and difficulty speaking in full sentences appreciated on exam.  Chest x-ray has failed to demonstrate any infiltrates or active cardiopulmonary process.  Given patient history and risk for decompensation while presenting SIRS Criteria, TR[facility] has been contacted to place in the hospital for further evaluation and management.  Cultures taken and empiric antibiotic started with presumption of aspiration pneumonia.    Hospital Course:  1-acute respiratory failure with hypoxia in the setting of presumed aspiration pneumonia -Good oxygen saturation while using 4-5 L nasal cannula supplementation. -patient is afebrile and with the rest of his vital signs within normal limits. -Continue to have episodes of shortness of breath and intermittent coughing spells. -Patient will complete treatment with oral Augmentin. -continue steroids tapering process at time of discharge. -Repeat chest x-ray demonstrating no acute infiltrates. -Patient has high risk for decompensation given underlying history of ALS.  Making him an overall poor prognosis long-term. -Oxygen supplementation has been arranged at time of discharge (5 L through nasal cannula).  Wean off as tolerated. -Continue as needed use of trilogy at bedtime. -Continue the use of flutter valve, chest vest for physiotherapy and also initiating treatment with Tussionex to assist with coughing spells (currently nonproductive). -Strep pneumo antigen negative -Legionella antigen negative.    2-dysphagia/choking episodes -Speech therapy evaluation appreciated; recommendation  for dysphagia 2 with nectar thick liquids; and after oral care okay to receive thin liquids while following chin tuck maneuver.  -Patient needs to be sitting in an upright position using bedside chair during meals. -Speech therapy has been requested to follow patient as part of his home health services. -ALS clinic to further discuss appropriateness for initiation and PEG tube placement.   3-gastroesophageal reflux disease/GI prophylaxis -Continue PPI. -Dose adjusted for better control (especially while receiving steroids).   4-Type 2 diabetes -Resume home hypoglycemic regimen. -A1c 5.3 -Current fluctuation in his blood sugar most likely trigger by steroids usage.   5-history of ALS -Continue outpatient follow-up with [facility] neurology service/ALS clinic. -Continue the use of Rilutek -Continue trilogy as needed and nightly as discussed above. -Revisit goals of care and advance care planning.   6-depression/anxiety -Overall stable mood appreciated. -No hallucinations or agitation -Continue Seroquel, Elavil, Cymbalta and as needed Xanax.   7-hypertension -Discontinue the use of lisinopril; in case there is a component of associated coughing spells. -Blood pressure has been running high in the setting of steroid usage. -Anticipating further stabilization as patient weaned off from steroids. -He has been discharged on Cozaar 50 mg daily. -Reassess blood pressure at follow-up visit.  Further adjustment to antihypertensive regimen as required.   8-hyperlipidemia -No longer using statins as has been seen on the studies that could potentially worsen ALS. -Discussed heart healthy diet.   9-history of glaucoma -Continue the use of Timolol as previously prescribed.   10-Ethics/social/CODE STATUS -Long discussion with patient and wife at bedside; they would like to rescind DNR status at this  time. -They understand that pursuing intubation very likely can lead to the requirement of a tracheostomy given his underlying history of ALS. -Based on ABG evaluation and current overall respiratory stability there was not need for intubation or any further invasive therapy at this time.   -Plan is for patient to go home at discharge with home health services. -Close outpatient follow-up with ALS clinic to further discuss advance care planning and revisit goals of care. -Oxygen will be delivered through the [place] system on 12/26/2020  Procedures: See below for x-ray reports.   Consultations: Speech therapy   Discharge Exam: Vitals:   12/26/20 1146 12/26/20 1300  BP:  (!) 191/79  Pulse: 87 81  Resp: (!) 23 (!) 23  Temp: 97.8 F (36.6 C)   SpO2: 93% 92%    General: Afebrile, no chest pain, no nausea, no vomiting.  Reports improvement in the control of his coughing spells and demonstrate good saturation with 5 L nasal cannula supplementation currently. Cardiovascular: S1 and S2, no rubs, no gallops, no JVD. Respiratory: Positive scattered rhonchi, no wheezing, orbital with poor respiratory effort and weak cough force. Abdomen: Soft, nontender, positive bowel sounds, no distention. Extremities: No cyanosis or clubbing.  Discharge Instructions   Discharge Instructions     Discharge instructions   Complete by: As directed    Repeat basic metabolic panel to follow to lites and renal function Arrange follow-up with ALS clinic as early as possible. -Follow-up with PCP in 10 days. Maintain adequate hydration Follow instructions on recommendation for safe swallowing. Continue to follow dysphagia 2 diet and nectar thickened liquids; after good oral care safe to drink thin liquids while following chin tuck maneuvers.   Increase activity slowly   Complete by: As directed       Allergies as of 12/26/2020   No Known Allergies      Medication List  STOP taking these  medications    lisinopril 20 MG tablet Commonly known as: ZESTRIL   UNABLE TO FIND       TAKE these medications    acetaminophen 325 MG tablet Commonly known as: TYLENOL Take 2 tablets (650 mg total) by mouth every 6 (six) hours as needed for mild pain (or Fever >/= 101).   ALPRAZolam 1 MG tablet Commonly known as: XANAX Take 0.5 mg by mouth 2 (two) times daily.   amitriptyline 10 MG tablet Commonly known as: ELAVIL Take 10 mg by mouth 2 (two) times daily.   amoxicillin-clavulanate 875-125 MG tablet Commonly known as: AUGMENTIN Take 1 tablet by mouth every 12 (twelve) hours for 4 days.   aspirin EC 81 MG tablet Take 81 mg by mouth daily.   chlorpheniramine-HYDROcodone 10-8 MG/5ML Suer Commonly known as: TUSSIONEX Take 5 mLs by mouth every 12 (twelve) hours as needed for cough.   DULoxetine 20 MG capsule Commonly known as: CYMBALTA Take 60 mg by mouth daily.   fluocinonide ointment 0.05 % Commonly known as: LIDEX Apply 1 application topically daily.   fluticasone 50 MCG/ACT nasal spray Commonly known as: FLONASE Place 2 sprays into both nostrils daily. Start taking on: December 27, 2020   insulin glargine 100 UNIT/ML injection Commonly known as: LANTUS Inject 30 Units into the skin daily.   losartan 50 MG tablet Commonly known as: Cozaar Take 1 tablet (50 mg total) by mouth daily.   melatonin 3 MG Tabs tablet Take 3 tablets by mouth at bedtime.   metFORMIN 500 MG tablet Commonly known as: GLUCOPHAGE Take 500 mg by mouth daily.   omeprazole 20 MG capsule Commonly known as: PRILOSEC Take 1 capsule (20 mg total) by mouth 2 (two) times daily before a meal. What changed: when to take this   predniSONE 20 MG tablet Commonly known as: DELTASONE Take 3 tablets by mouth daily x1 day; then 2 tablets by mouth daily X2 days; then 1 tablet by mouth daily x3 days; then half tablet by mouth daily x3 days and stop prednisone.   QUEtiapine 25 MG tablet Commonly  known as: SEROQUEL Take 25 mg by mouth at bedtime.   REFRESH OP Place 1 drop into the right eye in the morning and at bedtime.   riluzole 50 MG tablet Commonly known as: RILUTEK Take 50 mg by mouth in the morning and at bedtime.   timolol 0.5 % ophthalmic solution Commonly known as: BETIMOL Place 1 drop into the left eye 2 (two) times daily.   UNABLE TO FIND CPAP 16 at night.   Vitamin D3 25 MCG (1000 UT) Caps Take 1 capsule by mouth daily.   zolpidem 10 MG tablet Commonly known as: AMBIEN Take 5 mg by mouth at bedtime.               Durable Medical Equipment  (From admission, onward)           Start     Ordered   12/24/20 1542  For home use only DME oxygen  Once       Question Answer Comment  Length of Need Lifetime   Mode or (Route) Nasal cannula   Liters per Minute 4   Frequency Continuous (stationary and portable oxygen unit needed)   Oxygen conserving device Yes   Oxygen delivery system Gas      12/24/20 1541           No Known Allergies  Follow-up Information  Lavone Orn, MD. Schedule an appointment as soon as possible for a visit in 10 day(s).   Specialty: Internal Medicine Contact information: [address] [place]  [address] [phone]                The results of significant diagnostics from this hospitalization (including imaging, microbiology, ancillary and laboratory) are listed below for reference.    Significant Diagnostic Studies: US Venous Img Lower Bilateral (DVT)  Result Date: 12/23/2020 CLINICAL DATA:  Shortness of breath.  Elevated D-dimer. EXAM: BILATERAL LOWER EXTREMITY VENOUS DOPPLER ULTRASOUND TECHNIQUE: Gray-scale sonography with graded compression, as well as color Doppler and duplex ultrasound were performed to evaluate the lower extremity deep venous systems from the level of the common femoral vein and including the common femoral, femoral, profunda femoral, popliteal and calf  veins including the posterior tibial, peroneal and gastrocnemius veins when visible. The superficial great saphenous vein was also interrogated. Spectral Doppler was utilized to evaluate flow at rest and with distal augmentation maneuvers in the common femoral, femoral and popliteal veins. COMPARISON:  Chest radiograph, 12/22/2020. FINDINGS: RIGHT LOWER EXTREMITY Normal compressibility of the RIGHT common femoral, superficial femoral, and popliteal veins, as well as the visualized calf veins. Visualized portions of profunda femoral vein and great saphenous vein unremarkable. No filling defects to suggest DVT on grayscale or color Doppler imaging. Doppler waveforms show normal direction of venous flow, normal respiratory plasticity and response to augmentation. OTHER No evidence of superficial thrombophlebitis or abnormal fluid collection. LEFT LOWER EXTREMITY Normal compressibility of the LEFT common femoral, superficial femoral, and popliteal veins, as well as the visualized calf veins. Visualized portions of profunda femoral vein and great saphenous vein unremarkable. No filling defects to suggest DVT on grayscale or color Doppler imaging. Doppler waveforms show normal direction of venous flow, normal respiratory plasticity and response to augmentation. OTHER No evidence of superficial thrombophlebitis or abnormal fluid collection. IMPRESSION: No evidence of femoropopliteal DVT within either lower extremity. Michaelle Birks, MD [facility] Radiology Electronically Signed   By: Michaelle Birks M.D.   On: 12/23/2020 14:15   DG CHEST PORT 1 VIEW  Result Date: 12/24/2020 CLINICAL DATA:  70 year old male with history of shortness of breath. EXAM: PORTABLE CHEST 1 VIEW COMPARISON:  Chest x-ray 12/22/2020. FINDINGS: Elevation of the right hemidiaphragm. Lung volumes are low. Linear opacity in the left mid to lower lung, new compared to the prior study, compatible with some  subsegmental atelectasis. No consolidative airspace disease. No pleural effusions. No pneumothorax. No pulmonary nodule or mass noted. Pulmonary vasculature and the cardiomediastinal silhouette are within normal limits. IMPRESSION: 1. Low lung volumes without radiographic evidence of acute cardiopulmonary disease. Electronically Signed   By: Vinnie Langton M.D.   On: 12/24/2020 08:02   DG Chest Port 1 View  Result Date: 12/22/2020 CLINICAL DATA:  Cough with difficulty breathing since recent flu vaccination. Questionable sepsis. EXAM: PORTABLE CHEST 1 VIEW COMPARISON:  Radiographs 03/21/2009 and 11/28/2004. FINDINGS: 1016 hours. Lower lung volumes with probable resulting mild atelectasis at both lung bases. No confluent airspace opacity, pleural effusion or pneumothorax identified. The heart size and mediastinal contours are stable for technique. The bones appear unchanged. Telemetry leads overlie the chest. IMPRESSION: Probable mild bibasilar atelectasis associated with lower lung volumes. No evidence of pneumonia. Electronically Signed   By: Richardean Sale M.D.   On: 12/22/2020 10:25   DG Swallowing Func-Speech Pathology  Result Date: 12/25/2020 Table formatting from the original result  was not included. Objective Swallowing Evaluation: Type of Study: MBS-Modified Barium Swallow Study  Patient Details Name: DEBBIE BELLUCCI MRN: [id number] Date of Birth: 05/12/1950 Today's Date: 12/25/2020 Time: SLP Start Time (ACUTE ONLY): 1535 -SLP Stop Time (ACUTE ONLY): 1600 SLP Time Calculation (min) (ACUTE ONLY): 25 min Past Medical History: Past Medical History: Diagnosis Date  ALS (amyotrophic lateral sclerosis) ([place])   Anxiety   ARDS (adult respiratory distress syndrome) ([place]) 2003  Arthritis   Cancer ([place])   basal cell of skin  Clostridium difficile infection 2003  Depression   Diabetes ([place])   Gait abnormality 09/16/2018  GERD (gastroesophageal reflux disease)   Glaucoma   H/O agent Orange exposure   History  of ARDS 2007  Hypercholesteremia   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 patient developed ARDS/sepsis.  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 male with medical history significant of ALS (follow by neurology service at [facility]), gastroesophageal flux disease, depression/anxiety, type 2 diabetes, glaucoma and hyperlipidemia; who presented to the hospital secondary to worsening in his breathing, hypoxia, low-grade temperature coughing spells and congestion.  Patient and wife reported that he was vaccinated against flu on Wednesday (12/20/2020), and onset Thursday started developing URI symptoms that had rapidly progressed to the point of no receiving relief by the use of his trilogy machine at home.  Due to his underlying history of ALS patient not really able to properly expectorate and ended developing increased work of breathing.  EMS was contacted and on their arrival patient was found to be hypoxic into the mid 70s on room  air; he was placed on nonrebreather with improvement in his saturation and then able to been titrated down to 4 L nasal cannula with O2 sats in the mid 90s. Patient is afebrile, still complaining of coughing spells and SOB, even improved. Choking episode while eating appreciated. BSE ordered.  Subjective: "I can't stop coughing."  Recommendations for follow up therapy are one component of a multi-disciplinary discharge planning process, led by the attending physician.  Recommendations may be updated based on patient status, additional functional criteria and insurance authorization. Assessment / Plan / Recommendation Clinical Impressions 12/25/2020 Clinical Impression Pt presents with mild/moderate oropharyngeal phase dysphagia with weak lingual manipulation resulting in premature spillage of thin liquids over the base of the tongue into the laryngeal vestibule resulting in trace aspiration during the swallow. This occurred with tsp/cup/straw sips thin, however aspiration was eliminated with  Pt implemented small sip with chin tuck and repeat/dry swallow after the primary swallow to clear vallecular and lateral channel residuals. Nectar thick liquids were not penetrated/aspirated with or without the chin tuck. Pt first took a barium tablet with thin liquids, which resulted in aspiration of thins and stasis of the barium tablet in the valleculae. This was cleared with puree wash. He was then presented with a barium tablet in puree and he swallowed without incident. Regular textures resulted in the tail end of the bolus remaining near the PE segment along the posterior pharyngeal wall- which cleared with a dry swallow. Pt had a choking episode earlier today, which required the Heimlich maneuver for removal of green beans and thick noodles. Recommend D2 and ok for thin liquids with implementation of a chin tuck OR nectar-thick liquids pending Pt preference and how he does clinically over the course of a meal. Pt was  able to implement the chin tuck independently on 100% of trials after shown. This study and the recommendations were reviewed with Pt and spouse. They were given written information regarding recommendations and plan for f/u SLP services at the [place] clinic or via [facility] through the [place]. SLP will follow during acute stay. SLP Visit Diagnosis Dysphagia, oropharyngeal phase (R13.12) Attention and concentration deficit following -- Frontal lobe and executive function deficit following -- Impact on safety and function Moderate aspiration risk;Mild aspiration risk   Treatment Recommendations 12/25/2020 Treatment Recommendations Therapy as outlined in treatment plan below   Prognosis 12/25/2020 Prognosis for Safe Diet Advancement Fair Barriers to Reach Goals Other (Comment) Barriers/Prognosis Comment -- Diet Recommendations 12/25/2020 SLP Diet Recommendations Dysphagia 2 (Fine chop) solids;Nectar thick liquid;Thin liquid Liquid Administration via Straw;Cup Medication Administration Whole meds with puree Compensations Slow rate;Small sips/bites;Multiple dry swallows after each bite/sip;Use straw to facilitate chin tuck;Chin tuck Postural Changes Remain semi-upright after after feeds/meals (Comment);Seated upright at 90 degrees   Other Recommendations 12/25/2020 Recommended Consults -- Oral Care Recommendations Oral care BID;Staff/trained caregiver to provide oral care Other Recommendations Order thickener from pharmacy;Clarify dietary restrictions Follow Up Recommendations Home health SLP Assistance recommended at discharge Frequent or constant Supervision/Assistance Functional Status Assessment Patient has had a recent decline in their functional status and/or demonstrates limited ability to make significant improvements in function in a reasonable and predictable amount of time Frequency and Duration  12/25/2020 Speech Therapy Frequency (ACUTE ONLY) min 2x/week Treatment Duration 1 week   Oral Phase 12/25/2020 Oral Phase  Impaired Oral - Pudding Teaspoon -- Oral - Pudding Cup -- Oral - Honey Teaspoon -- Oral - Honey Cup -- Oral - Nectar Teaspoon -- Oral - Nectar Cup -- Oral - Nectar Straw WFL Oral - Thin Teaspoon Weak lingual manipulation;Premature spillage Oral - Thin Cup Premature spillage Oral - Thin Straw WFL Oral - Puree WFL Oral - Mech Soft -- Oral - Regular WFL Oral - Multi-Consistency -- Oral - Pill Decreased bolus cohesion Oral Phase - Comment --  Pharyngeal Phase 12/25/2020 Pharyngeal Phase Impaired Pharyngeal- Pudding Teaspoon -- Pharyngeal -- Pharyngeal- Pudding Cup -- Pharyngeal -- Pharyngeal- Honey Teaspoon -- Pharyngeal -- Pharyngeal- Honey Cup -- Pharyngeal -- Pharyngeal- Nectar Teaspoon -- Pharyngeal -- Pharyngeal- Nectar Cup -- Pharyngeal -- Pharyngeal- Nectar Straw Delayed swallow initiation-vallecula;Pharyngeal residue - valleculae;Lateral channel residue Pharyngeal -- Pharyngeal- Thin Teaspoon Delayed swallow initiation-pyriform sinuses;Reduced airway/laryngeal closure;Reduced tongue base retraction;Penetration/Aspiration during swallow;Trace aspiration;Pharyngeal residue - valleculae;Lateral channel residue Pharyngeal Material enters airway, passes BELOW cords without attempt by patient to eject out (silent aspiration) Pharyngeal- Thin Cup Delayed  swallow initiation-pyriform sinuses;Reduced airway/laryngeal closure;Penetration/Aspiration during swallow;Trace aspiration;Pharyngeal residue - valleculae;Lateral channel residue Pharyngeal Material enters airway, passes BELOW cords without attempt by patient to eject out (silent aspiration);Material does not enter airway Pharyngeal- Thin Straw Delayed swallow initiation-pyriform sinuses;Reduced airway/laryngeal closure;Reduced tongue base retraction;Penetration/Aspiration during swallow;Trace aspiration;Pharyngeal residue - valleculae;Lateral channel residue Pharyngeal Material enters airway, passes BELOW cords without attempt by patient to eject out (silent  aspiration);Material does not enter airway Pharyngeal- Puree Delayed swallow initiation-vallecula;WFL Pharyngeal -- Pharyngeal- Mechanical Soft -- Pharyngeal -- Pharyngeal- Regular Delayed swallow initiation-vallecula;Pharyngeal residue - cp segment Pharyngeal -- Pharyngeal- Multi-consistency -- Pharyngeal -- Pharyngeal- Pill (No Data) Pharyngeal -- Pharyngeal Comment --  Cervical Esophageal Phase  12/25/2020 Cervical Esophageal Phase WFL Pudding Teaspoon -- Pudding Cup -- Honey Teaspoon -- Honey Cup -- Nectar Teaspoon -- Nectar Cup -- Nectar Straw -- Thin Teaspoon -- Thin Cup -- Thin Straw -- Puree -- Mechanical Soft -- Regular -- Multi-consistency -- Pill -- Cervical Esophageal Comment -- Thank you, Genene Churn, CCC-SLP [id number] [place] 12/25/2020, 10:22 PM                      Microbiology: Recent Results (from the past 240 hour(s))  Resp Panel by RT-PCR (Flu A&B, Covid) Nasopharyngeal Swab     Status: None   Collection Time: 12/22/20 10:19 AM   Specimen: Nasopharyngeal Swab; Nasopharyngeal(NP) swabs in vial transport medium  Result Value Ref Range Status   SARS Coronavirus 2 by RT PCR NEGATIVE NEGATIVE Final    Comment: (NOTE) SARS-CoV-2 target nucleic acids are NOT DETECTED.  The SARS-CoV-2 RNA is generally detectable in upper respiratory specimens during the acute phase of infection. The lowest concentration of SARS-CoV-2 viral copies this assay can detect is 138 copies/mL. A negative result does not preclude SARS-Cov-2 infection and should not be used as the sole basis for treatment or other patient management decisions. A negative result may occur with  improper specimen collection/handling, submission of specimen other than nasopharyngeal swab, presence of viral mutation(s) within the areas targeted by this assay, and inadequate number of viral copies(<138 copies/mL). A negative result must be combined with clinical observations, patient history, and  epidemiological information. The expected result is Negative.  Fact Sheet for Patients:  EntrepreneurPulse.com.au  Fact Sheet for Healthcare Providers:  IncredibleEmployment.be  This test is no t yet approved or cleared by the [place] FDA and  has been authorized for detection and/or diagnosis of SARS-CoV-2 by FDA under an Emergency Use Authorization (EUA). This EUA will remain  in effect (meaning this test can be used) for the duration of the COVID-19 declaration under Section 564(b)(1) of the Act, 21 U.S.C.section 360bbb-3(b)(1), unless the authorization is terminated  or revoked sooner.       Influenza A by PCR NEGATIVE NEGATIVE Final   Influenza B by PCR NEGATIVE NEGATIVE Final    Comment: (NOTE) The Xpert Xpress SARS-CoV-2/FLU/RSV plus assay is intended as an aid in the diagnosis of influenza from Nasopharyngeal swab specimens and should not be used as a sole basis for treatment. Nasal washings and aspirates are unacceptable for Xpert Xpress SARS-CoV-2/FLU/RSV testing.  Fact Sheet for Patients: EntrepreneurPulse.com.au  Fact Sheet for Healthcare Providers: IncredibleEmployment.be  This test is not yet approved or cleared by the [place] FDA and has been authorized for detection and/or diagnosis of SARS-CoV-2 by FDA under an Emergency Use Authorization (EUA). This EUA will remain in effect (meaning this test can be used) for the duration of the COVID-19 declaration under  Section 564(b)(1) of the Act, 21 U.S.C. section 360bbb-3(b)(1), unless the authorization is terminated or revoked.  Performed at [facility], [address] [place], [place] [address]   Blood Culture (routine x 2)     Status: None (Preliminary result)   Collection Time: 12/22/20 10:36 AM   Specimen: BLOOD LEFT ARM  Result Value Ref Range Status   Specimen Description BLOOD LEFT ARM  Final   Special Requests   Final     Immunocompromised BOTTLES DRAWN AEROBIC AND ANAEROBIC Blood Culture adequate volume   Culture   Final    NO GROWTH 4 DAYS Performed at [facility], [address], [place], [place] [address]    Report Status PENDING  Incomplete  Blood Culture (routine x 2)     Status: None (Preliminary result)   Collection Time: 12/22/20 10:41 AM   Specimen: BLOOD LEFT ARM  Result Value Ref Range Status   Specimen Description BLOOD LEFT ARM  Final   Special Requests   Final    Immunocompromised BOTTLES DRAWN AEROBIC AND ANAEROBIC Blood Culture adequate volume   Culture   Final    NO GROWTH 4 DAYS Performed at [facility], [address] [place], [place] [address]    Report Status PENDING  Incomplete  MRSA Next Gen by PCR, Nasal     Status: None   Collection Time: 12/22/20  9:50 PM   Specimen: Nasal Mucosa; Nasal Swab  Result Value Ref Range Status   MRSA by PCR Next Gen NOT DETECTED NOT DETECTED Final    Comment: (NOTE) The GeneXpert MRSA Assay (FDA approved for NASAL specimens only), is one component of a comprehensive MRSA colonization surveillance program. It is not intended to diagnose MRSA infection nor to guide or monitor treatment for MRSA infections. Test performance is not FDA approved in patients less than 25 years old. Performed at [facility], [address], [place], [place] [address]     Labs: Basic Metabolic Panel: Recent Labs  Lab 12/22/20 0950 12/23/20 0604 12/24/20 0543  NA 138 135 135  K 4.2 3.7 3.9  CL 99 103 103  CO2 30 26 25   GLUCOSE 152* 144* 191*  BUN 9 10 15   CREATININE 0.55* 0.51* 0.41*  CALCIUM 8.9 8.0* 8.4*  MG  --  1.6* 2.0  PHOS  --  2.3* 2.7   Liver Function Tests: Recent Labs  Lab 12/22/20 0950  AST 22  ALT 20  ALKPHOS 69  BILITOT 1.1  PROT 7.3  ALBUMIN 4.0   CBC: Recent Labs  Lab 12/22/20 0950 12/23/20 0604 12/24/20 0543  WBC 14.1* 14.8* 14.4*  NEUTROABS 12.7*  --   --   HGB 15.5 11.8* 12.7*  HCT 46.7 34.1* 38.2*  MCV 88.1  86.1 85.5  PLT 203 179 203   BNP (last 3 results) Recent Labs    12/22/20 0950  BNP 63.0   CBG: Recent Labs  Lab 12/25/20 1129 12/25/20 1607 12/25/20 2111 12/26/20 0730 12/26/20 1148  GLUCAP 183* 248* 222* 120* 188*    Signed:  Barton Dubois MD.  [facility] 12/26/2020, 3:25 PM

## 2020-12-26 NOTE — TOC Progression Note (Addendum)
Transition of Care [facility]) - Progression Note    Patient Details  Name: Javier Cook MRN: [id number] Date of Birth: 1951/01/27  Transition of [facility] CM/SW Contact  Javier Gully, LCSW Phone Number: 12/26/2020, 11:24 AM  Clinical Narrative:    Estill Bamberg with [facility] called and TOC facilitated call with wife. Orders have been sent to [facility] and they will be in touch with wife within four hours. Calvin with [place] accepting of referral for [facility] and will start authorization for [facility] with [facility].    Expected Discharge Plan: Home/Self Care Barriers to Discharge: No Barriers Identified  Expected Discharge Plan and Services Expected Discharge Plan: Home/Self Care     Post Acute Care Choice: Durable Medical Equipment Living arrangements for the past 2 months: Single Family Home                 DME Arranged: Oxygen DME Agency: [place] Date DME Agency Contacted: 12/25/20 Time DME Agency Contacted: 1214 Representative spoke with at DME Agency: information faxed to [phone]             Social Determinants of Health (SDOH) Interventions    Readmission Risk Interventions No flowsheet data found.

## 2020-12-27 LAB — CULTURE, BLOOD (ROUTINE X 2)
Culture: NO GROWTH
Culture: NO GROWTH

## 2021-03-14 DEATH — deceased

## 2022-05-20 IMAGING — US US EXTREM LOW VENOUS
1 series · 13 of 24 positions shown · non-contrast
Comparison: Chest radiograph, 12/22/2020.

CLINICAL DATA: Shortness of breath.  Elevated D-dimer.



[Series 1: us venous img lower bilat (dvt) · portal-venous · 13 of 78 slices shown]
[im 1/78]
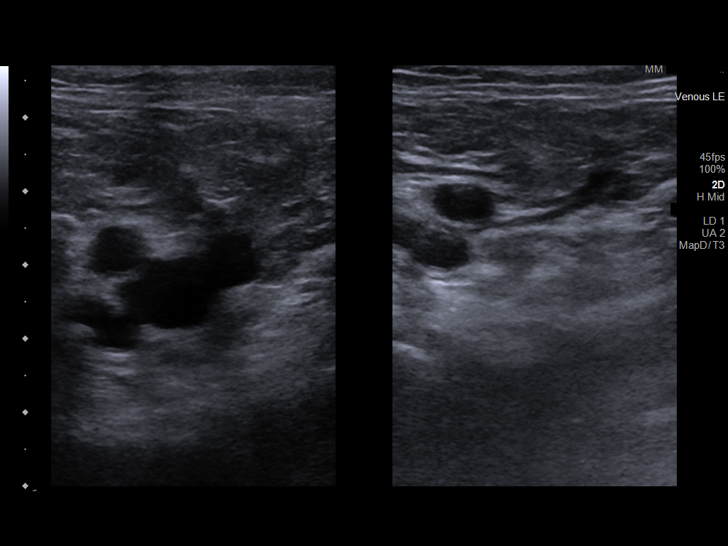
[im 7/78]
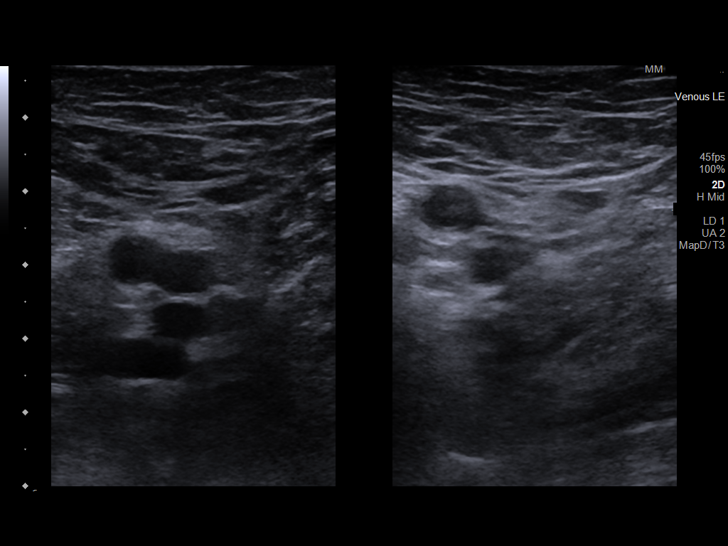
[im 14/78]
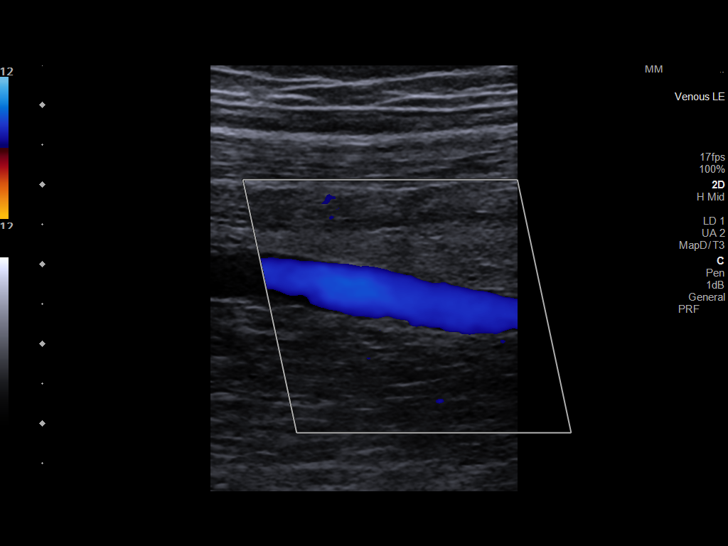
[im 21/78]
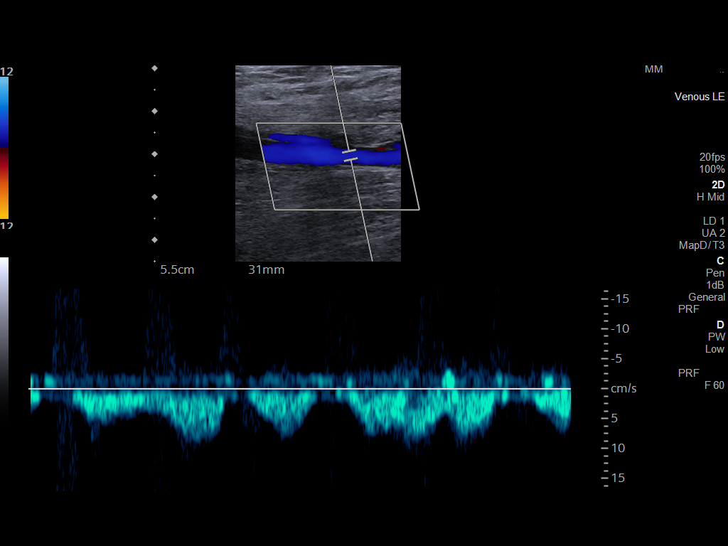
[im 27/78]
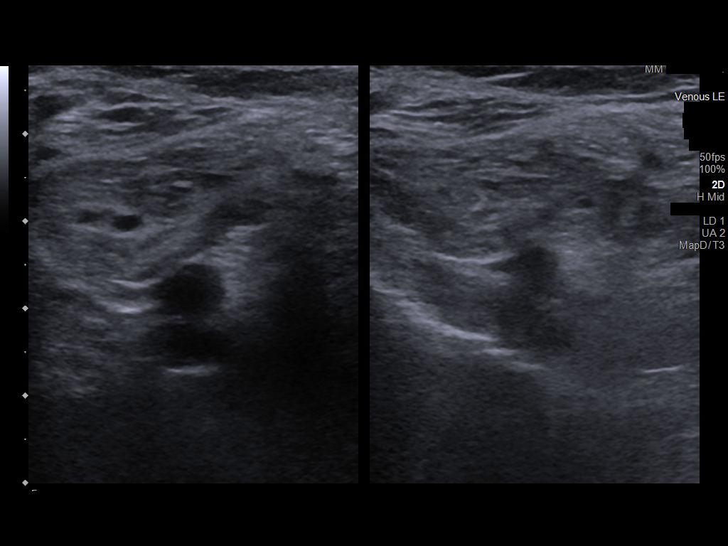
[im 34/78]
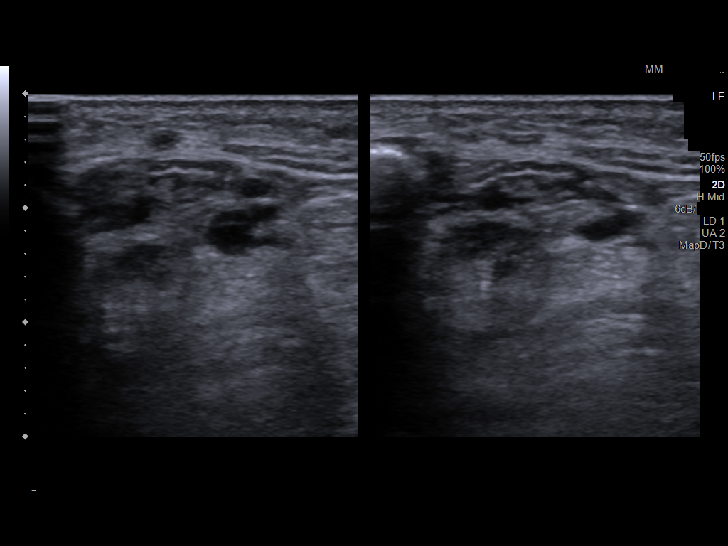
[im 41/78]
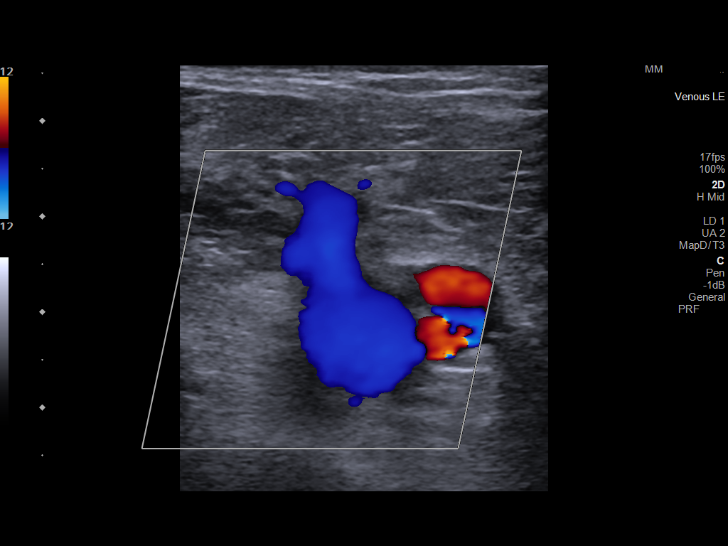
[im 44/78]
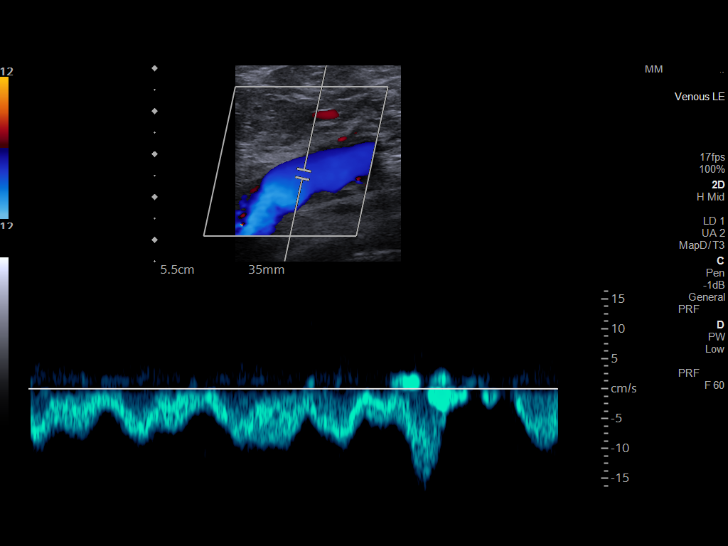
[im 51/78]
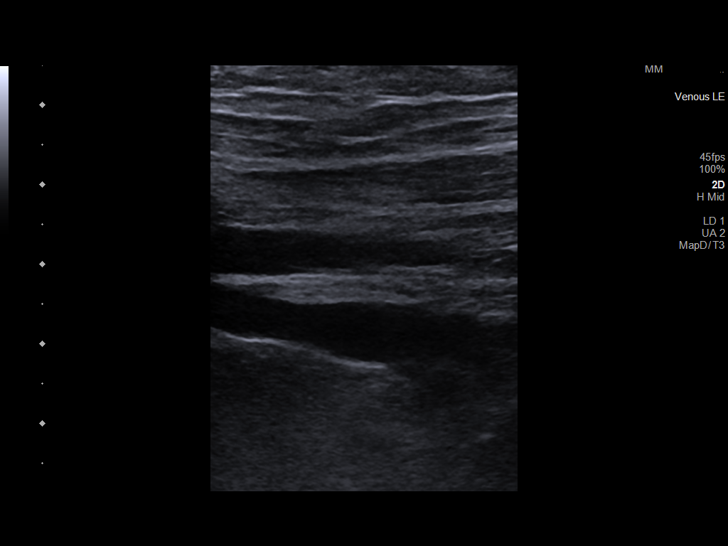
[im 57/78]
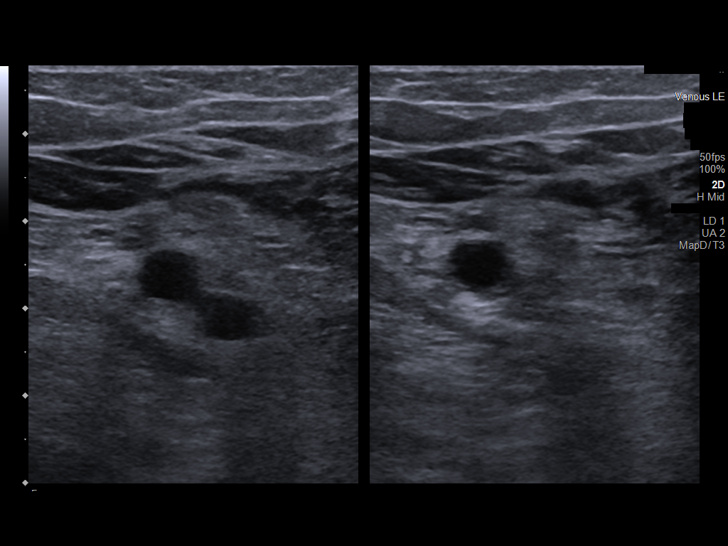
[im 64/78]
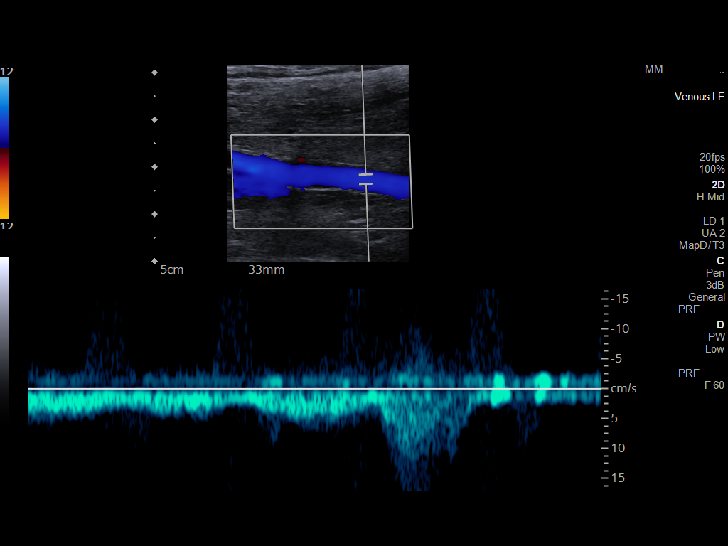
[im 71/78]
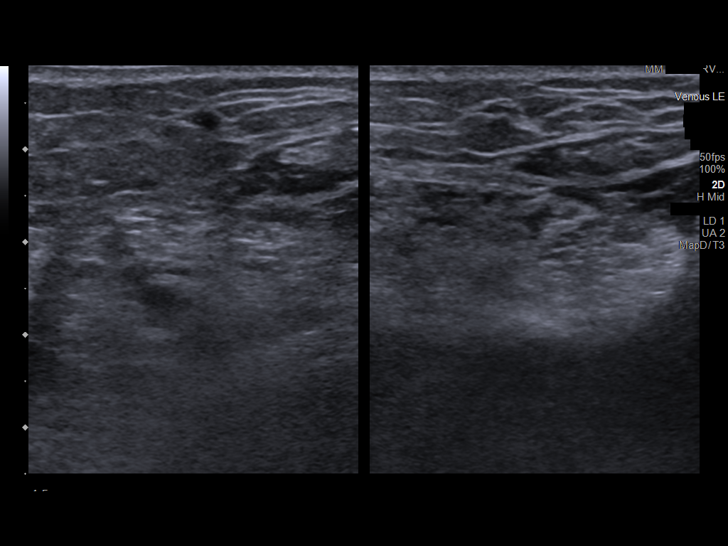
[im 78/78]
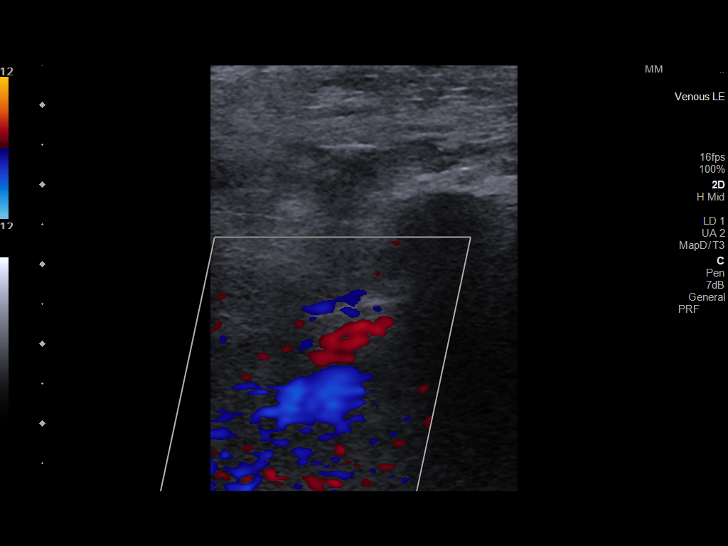

[13 of 24 positions shown; findings below may reference images not displayed]

FINDINGS: RIGHT LOWER EXTREMITY

Normal compressibility of the RIGHT common femoral, superficial
femoral, and popliteal veins, as well as the visualized calf veins.
Visualized portions of profunda femoral vein and great saphenous
vein unremarkable. No filling defects to suggest DVT on grayscale or
color Doppler imaging. Doppler waveforms show normal direction of
venous flow, normal respiratory plasticity and response to
augmentation.

OTHER

No evidence of superficial thrombophlebitis or abnormal fluid
collection.

LEFT LOWER EXTREMITY

Normal compressibility of the LEFT common femoral, superficial
femoral, and popliteal veins, as well as the visualized calf veins.
Visualized portions of profunda femoral vein and great saphenous
vein unremarkable. No filling defects to suggest DVT on grayscale or
color Doppler imaging. Doppler waveforms show normal direction of
venous flow, normal respiratory plasticity and response to
augmentation.

OTHER

No evidence of superficial thrombophlebitis or abnormal fluid
collection.
IMPRESSION: No evidence of femoropopliteal DVT within either lower extremity.

## 2022-05-21 IMAGING — DX DG CHEST 1V PORT
1 series · 1 of 1 positions shown · non-contrast
Comparison: Chest x-ray 12/22/2020.

CLINICAL DATA: 70-year-old male with history of shortness of
breath.

EXAM:
PORTABLE CHEST 1 VIEW

[chest ap]
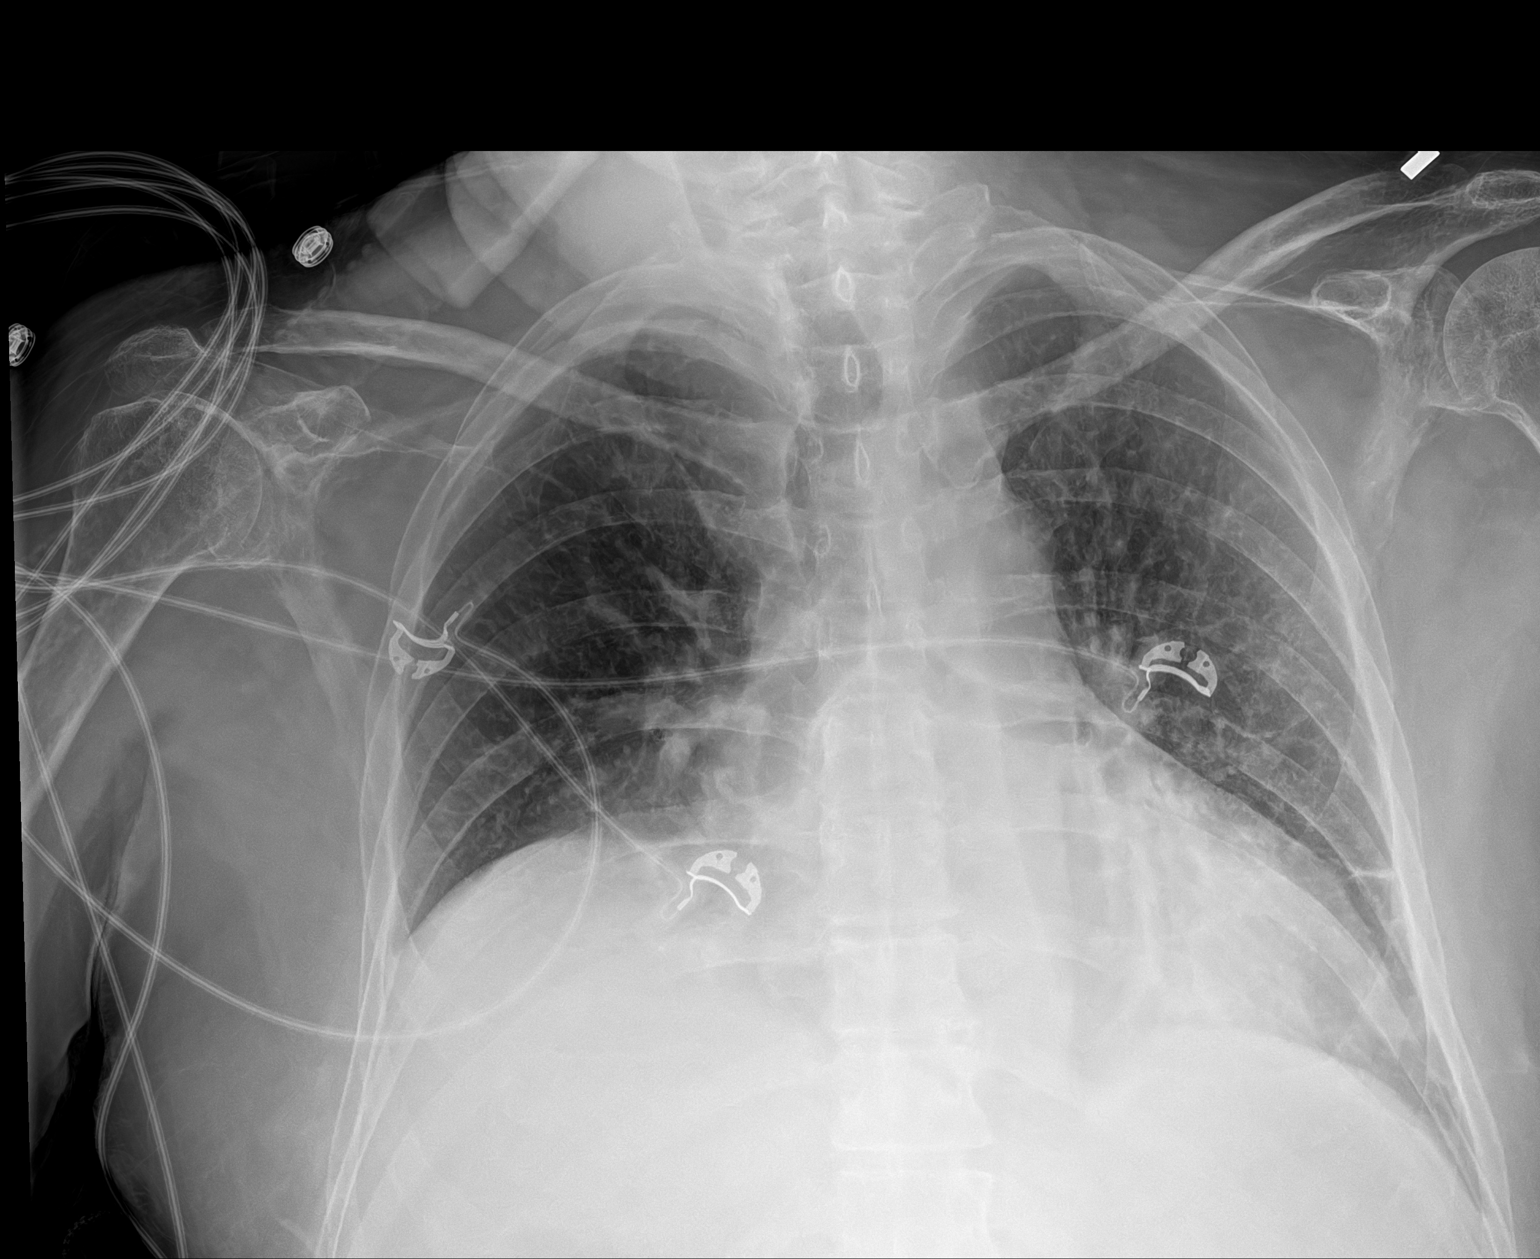

[1 of 1 positions shown; findings below may reference images not displayed]

FINDINGS: Elevation of the right hemidiaphragm. Lung volumes are low. Linear
opacity in the left mid to lower lung, new compared to the prior
study, compatible with some subsegmental atelectasis. No
consolidative airspace disease. No pleural effusions. No
pneumothorax. No pulmonary nodule or mass noted. Pulmonary
vasculature and the cardiomediastinal silhouette are within normal
limits.
IMPRESSION: 1. Low lung volumes without radiographic evidence of acute
cardiopulmonary disease.
